# Patient Record
Sex: Male | Born: 1965 | Race: White | Hispanic: No | Marital: Single | Smoking: Current some day smoker
Health system: Southern US, Community
[De-identification: ages and names within clinical notes are randomized; demographics above are authoritative.]

## PROBLEM LIST (undated history)

## (undated) DIAGNOSIS — G5600 Carpal tunnel syndrome, unspecified upper limb: Secondary | ICD-10-CM

## (undated) DIAGNOSIS — M5020 Other cervical disc displacement, unspecified cervical region: Secondary | ICD-10-CM

---

## 1998-12-06 ENCOUNTER — Emergency Department (HOSPITAL_COMMUNITY): Admission: EM | Admit: 1998-12-06 | Discharge: 1998-12-06 | Payer: Self-pay | Admitting: Emergency Medicine

## 2005-05-02 ENCOUNTER — Emergency Department (HOSPITAL_COMMUNITY): Admission: EM | Admit: 2005-05-02 | Discharge: 2005-05-02 | Payer: Self-pay | Admitting: Emergency Medicine

## 2006-03-07 ENCOUNTER — Emergency Department (HOSPITAL_COMMUNITY): Admission: EM | Admit: 2006-03-07 | Discharge: 2006-03-07 | Payer: Self-pay | Admitting: Emergency Medicine

## 2006-03-11 ENCOUNTER — Emergency Department (HOSPITAL_COMMUNITY): Admission: EM | Admit: 2006-03-11 | Discharge: 2006-03-11 | Payer: Self-pay | Admitting: Emergency Medicine

## 2006-12-21 ENCOUNTER — Emergency Department (HOSPITAL_COMMUNITY): Admission: EM | Admit: 2006-12-21 | Discharge: 2006-12-21 | Payer: Self-pay | Admitting: Emergency Medicine

## 2007-10-04 ENCOUNTER — Emergency Department (HOSPITAL_COMMUNITY): Admission: EM | Admit: 2007-10-04 | Discharge: 2007-10-04 | Payer: Self-pay | Admitting: Emergency Medicine

## 2007-11-18 ENCOUNTER — Emergency Department (HOSPITAL_COMMUNITY): Admission: EM | Admit: 2007-11-18 | Discharge: 2007-11-18 | Payer: Self-pay | Admitting: Emergency Medicine

## 2008-07-24 ENCOUNTER — Emergency Department (HOSPITAL_COMMUNITY): Admission: EM | Admit: 2008-07-24 | Discharge: 2008-07-24 | Payer: Self-pay | Admitting: Emergency Medicine

## 2008-07-27 ENCOUNTER — Emergency Department (HOSPITAL_COMMUNITY): Admission: EM | Admit: 2008-07-27 | Discharge: 2008-07-27 | Payer: Self-pay | Admitting: Emergency Medicine

## 2008-12-23 ENCOUNTER — Emergency Department (HOSPITAL_COMMUNITY): Admission: EM | Admit: 2008-12-23 | Discharge: 2008-12-23 | Payer: Self-pay | Admitting: Emergency Medicine

## 2008-12-27 ENCOUNTER — Emergency Department (HOSPITAL_COMMUNITY): Admission: EM | Admit: 2008-12-27 | Discharge: 2008-12-27 | Payer: Self-pay | Admitting: Emergency Medicine

## 2009-01-03 ENCOUNTER — Emergency Department (HOSPITAL_COMMUNITY): Admission: EM | Admit: 2009-01-03 | Discharge: 2009-01-03 | Payer: Self-pay | Admitting: Emergency Medicine

## 2009-01-09 ENCOUNTER — Emergency Department (HOSPITAL_COMMUNITY): Admission: EM | Admit: 2009-01-09 | Discharge: 2009-01-09 | Payer: Self-pay | Admitting: Emergency Medicine

## 2009-04-13 ENCOUNTER — Emergency Department (HOSPITAL_COMMUNITY): Admission: EM | Admit: 2009-04-13 | Discharge: 2009-04-13 | Payer: Self-pay | Admitting: Emergency Medicine

## 2009-05-17 ENCOUNTER — Emergency Department (HOSPITAL_COMMUNITY): Admission: EM | Admit: 2009-05-17 | Discharge: 2009-05-17 | Payer: Self-pay | Admitting: Emergency Medicine

## 2010-07-05 LAB — URINALYSIS, ROUTINE W REFLEX MICROSCOPIC
Bilirubin Urine: NEGATIVE
Bilirubin Urine: NEGATIVE
Glucose, UA: NEGATIVE mg/dL
Glucose, UA: NEGATIVE mg/dL
Hgb urine dipstick: NEGATIVE
Ketones, ur: NEGATIVE mg/dL
Nitrite: NEGATIVE
Protein, ur: NEGATIVE mg/dL
Protein, ur: NEGATIVE mg/dL
Specific Gravity, Urine: 1.028 (ref 1.005–1.030)
Urobilinogen, UA: 1 mg/dL (ref 0.0–1.0)
Urobilinogen, UA: 1 mg/dL (ref 0.0–1.0)
pH: 6 (ref 5.0–8.0)
pH: 6.5 (ref 5.0–8.0)

## 2012-07-09 ENCOUNTER — Emergency Department (HOSPITAL_COMMUNITY): Payer: Self-pay

## 2012-07-09 ENCOUNTER — Encounter (HOSPITAL_COMMUNITY): Payer: Self-pay | Admitting: Emergency Medicine

## 2012-07-09 ENCOUNTER — Emergency Department (HOSPITAL_COMMUNITY)
Admission: EM | Admit: 2012-07-09 | Discharge: 2012-07-09 | Disposition: A | Discharge status: Home Health | Payer: Self-pay | Attending: Emergency Medicine | Admitting: Emergency Medicine

## 2012-07-09 DIAGNOSIS — M25519 Pain in unspecified shoulder: Secondary | ICD-10-CM | POA: Insufficient documentation

## 2012-07-09 DIAGNOSIS — R0602 Shortness of breath: Secondary | ICD-10-CM | POA: Insufficient documentation

## 2012-07-09 DIAGNOSIS — F17200 Nicotine dependence, unspecified, uncomplicated: Secondary | ICD-10-CM | POA: Insufficient documentation

## 2012-07-09 DIAGNOSIS — M25512 Pain in left shoulder: Secondary | ICD-10-CM

## 2012-07-09 DIAGNOSIS — R079 Chest pain, unspecified: Secondary | ICD-10-CM

## 2012-07-09 LAB — CBC
HCT: 47.7 % (ref 39.0–52.0)
MCH: 31.3 pg (ref 26.0–34.0)
MCHC: 35 g/dL (ref 30.0–36.0)

## 2012-07-09 LAB — BASIC METABOLIC PANEL
CO2: 23 mEq/L (ref 19–32)
Glucose, Bld: 113 mg/dL — ABNORMAL HIGH (ref 70–99)
Sodium: 138 mEq/L (ref 135–145)

## 2012-07-09 LAB — D-DIMER, QUANTITATIVE: D-Dimer, Quant: <0.27 ug/mL-FEU (ref 0.00–0.48)

## 2012-07-09 MED ORDER — CYCLOBENZAPRINE HCL 10 MG PO TABS
10.0000 mg | ORAL_TABLET | Freq: Once | ORAL | Status: AC
  Administered 2012-07-09: 10 mg via ORAL
  Filled 2012-07-09: qty 1

## 2012-07-09 MED ORDER — NAPROXEN 250 MG PO TABS
500.0000 mg | ORAL_TABLET | Freq: Once | ORAL | Status: AC
  Administered 2012-07-09: 500 mg via ORAL
  Filled 2012-07-09: qty 2

## 2012-07-09 MED ORDER — CYCLOBENZAPRINE HCL 10 MG PO TABS: 10.0000 mg | ORAL_TABLET | Freq: Two times a day (BID) | ORAL | Status: DC | PRN

## 2012-07-09 MED ORDER — NAPROXEN 500 MG PO TABS: 500.0000 mg | ORAL_TABLET | Freq: Two times a day (BID) | ORAL | Status: DC

## 2012-07-09 NOTE — ED Provider Notes (Signed)
History     CSN: [id number]  Arrival date & time 07/09/12  4098   First MD Initiated Contact with Patient 07/09/12 1018      Chief Complaint  Patient presents with  . Chest Pain  . Arm Pain    (Consider location/radiation/quality/duration/timing/severity/associated sxs/prior treatment) HPI Comments: Patient is a 47 year old male with no significant past medical history who presents with chest pain for the past 3 days. Patient reports having gradual onset of aching chest pain that was located in the left chest and radiated to left shoulder. The patient reports associated shoulder pain that radiates to his chest with shoulder movement and SOB. The pain has been constant and made worse with left shoulder movement. He has not taken anything for pain. No alleviating factors. Patient denies fever, headache, visual changes, vomiting, diarrhea, abdominal pain, numbness/tingling.     History reviewed. No pertinent past medical history.  History reviewed. No pertinent past surgical history.  No family history on file.  History  Substance Use Topics  . Smoking status: Current Every Day Smoker  . Smokeless tobacco: Not on file  . Alcohol Use: Yes      Review of Systems  Respiratory: Positive for shortness of breath.   Cardiovascular: Positive for chest pain.  Musculoskeletal: Positive for arthralgias.  All other systems reviewed and are negative.    Allergies  Review of patient's allergies indicates no known allergies.  Home Medications  No current outpatient prescriptions on file.  BP 124/84  Pulse 90  Temp(Src) 98.3 F (36.8 C) (Oral)  Resp 16  Ht 5\' 8"  (1.727 m)  Wt 190 lb (86.183 kg)  BMI 28.9 kg/m2  SpO2 97%  Physical Exam  Nursing note and vitals reviewed. Constitutional: He is oriented to person, place, and time. He appears well-developed and well-nourished. No distress.  HENT:  Head: Normocephalic and atraumatic.  Eyes: Conjunctivae and EOM are normal.   Neck: Normal range of motion.  Cardiovascular: Normal rate and regular rhythm.  Exam reveals no gallop and no friction rub.   No murmur heard. Pulmonary/Chest: Effort normal and breath sounds normal. He has no wheezes. He has no rales. He exhibits no tenderness.  Abdominal: Soft. He exhibits no distension. There is no tenderness. There is no rebound.  Musculoskeletal:  Left shoulder ROM limited due to pain.   Neurological: He is alert and oriented to person, place, and time. Coordination normal.  Speech is goal-oriented. Moves limbs without ataxia.   Skin: Skin is warm and dry.  Psychiatric: He has a normal mood and affect. His behavior is normal.    ED Course  Procedures (including critical care time)  Labs Reviewed  BASIC METABOLIC PANEL - Abnormal; Notable for the following:    Glucose, Bld 113 (*)    GFR calc non Af Amer 83 (*)    All other components within normal limits  CBC  D-DIMER, QUANTITATIVE  POCT I-STAT TROPONIN I   Dg Chest 2 View  07/09/2012  *RADIOLOGY REPORT*  Clinical Data: Left upper chest and shoulder pain  CHEST - 2 VIEW  Comparison: Prior thoracic spine radiographs including a portion of the chest dated 10/04/2007  Findings: Negative for pulmonary edema, focal consolidation, pneumothorax or pleural effusion.  The heart is within normal limits for size.  The thoracic aorta is tortuous.  Mild central bronchitic changes and prominence of the interstitial markings.  No acute osseous abnormality.  IMPRESSION:  1.  No acute cardiopulmonary process. 2.  Mildly tortuous  thoracic aorta 3.  Mild central bronchitic changes and nonspecific prominence of the interstitial markings.   Original Report Authenticated By: Malachy Moan, M.D.      1. Chest pain   2. Left shoulder pain       MDM  10:21 AM Labs and chest xray pending.   1:02 PM Labs, chest xray, d-dimer and troponin unremarkable. Patient feeling better after Naproxen. Patient likely having chest pain  that is musculoskeletal and/or radiating from his left shoulder. The pain is reproducible and made worse with movement, making the pain unlikely cardiac. Patient will be treated with Naproxen and flexeril. Patient will have recommended follow up with a medical provider from the resource guide. Patient instructed to return with worsening or concerning symptoms.      Emilia Beck, PA-C 07/09/12 1310

## 2012-07-09 NOTE — ED Provider Notes (Signed)
Medical screening examination/treatment/procedure(s) were performed by non-physician practitioner and as supervising physician I was immediately available for consultation/collaboration.  Gladys Deckard L Kalli Greenfield, MD 07/09/12 1735 

## 2012-07-09 NOTE — ED Notes (Signed)
Onset 3 days left side chest pain radiating to left upper extremity with shortness of breath.  States pain worsening with certain movements of left upper extremity. Full sensation radial pulse +2.

## 2012-11-01 ENCOUNTER — Encounter (HOSPITAL_COMMUNITY): Payer: Self-pay | Admitting: Cardiology

## 2012-11-01 ENCOUNTER — Emergency Department (HOSPITAL_COMMUNITY)
Admission: EM | Admit: 2012-11-01 | Discharge: 2012-11-01 | Disposition: A | Discharge status: Home / Self Care | Payer: Self-pay | Attending: Emergency Medicine | Admitting: Emergency Medicine

## 2012-11-01 DIAGNOSIS — M542 Cervicalgia: Secondary | ICD-10-CM | POA: Insufficient documentation

## 2012-11-01 DIAGNOSIS — Z885 Allergy status to narcotic agent status: Secondary | ICD-10-CM | POA: Insufficient documentation

## 2012-11-01 DIAGNOSIS — F17200 Nicotine dependence, unspecified, uncomplicated: Secondary | ICD-10-CM | POA: Insufficient documentation

## 2012-11-01 MED ORDER — DIAZEPAM 5 MG PO TABS
5.0000 mg | ORAL_TABLET | Freq: Once | ORAL | Status: AC
  Administered 2012-11-01: 5 mg via ORAL
  Filled 2012-11-01: qty 1

## 2012-11-01 MED ORDER — DIAZEPAM 5 MG PO TABS: 5.0000 mg | ORAL_TABLET | Freq: Four times a day (QID) | ORAL | Status: DC | PRN

## 2012-11-01 NOTE — ED Provider Notes (Signed)
CSN: [id number]     Arrival date & time 11/01/12  1245 History     First MD Initiated Contact with Patient 11/01/12 1344     Chief Complaint  Patient presents with  . Neck Pain   (Consider location/radiation/quality/duration/timing/severity/associated sxs/prior Treatment) HPI  Christopher Hughes is a 47 y.o. male complaining of left-sided neck pain worsening over the course last 48 hours. Patient has been taking ibuprofen at home with no relief. He denies any trauma to the area. Denies weakness, numbness, paresthesia. He rates his pain as severe, 8/10, exacerbated by right lateral flexion.    History reviewed. No pertinent past medical history. History reviewed. No pertinent past surgical history. History reviewed. No pertinent family history. History  Substance Use Topics  . Smoking status: Current Every Day Smoker  . Smokeless tobacco: Not on file  . Alcohol Use: Yes    Review of Systems 10 systems reviewed and found to be negative, except as noted in the HPI  Allergies  Codeine  Home Medications   Current Outpatient Rx  Name  Route  Sig  Dispense  Refill  . Ibuprofen (IBU PO)   Oral   Take 4 tablets by mouth every 6 (six) hours as needed (pain).         . diazepam (VALIUM) 5 MG tablet   Oral   Take 1 tablet (5 mg total) by mouth every 6 (six) hours as needed (muscle spasms).   15 tablet   0    BP 137/85  Pulse 78  Temp(Src) 97.7 F (36.5 C) (Oral)  Resp 20  SpO2 98% Physical Exam  Nursing note and vitals reviewed. Constitutional: He is oriented to person, place, and time. He appears well-developed and well-nourished. No distress.  HENT:  Head: Normocephalic and atraumatic.  Mouth/Throat: Oropharynx is clear and moist.  Eyes: Conjunctivae and EOM are normal. Pupils are equal, round, and reactive to light.  Neck: Normal range of motion. Neck supple.  No midline tenderness to palpation or step-offs appreciated. Patient has full range of motion without  pain.  Left-sided paracervical musculature spasm and tenderness to palpation  Cardiovascular: Normal rate.   Pulmonary/Chest: Effort normal and breath sounds normal. No stridor. No respiratory distress. He has no wheezes. He has no rales. He exhibits no tenderness.  Abdominal: Soft. Bowel sounds are normal. He exhibits no distension and no mass. There is no tenderness. There is no rebound and no guarding.  Musculoskeletal: Normal range of motion. He exhibits no edema and no tenderness.  Neurological: He is alert and oriented to person, place, and time.  Follows commands, Goal oriented speech, Strength is 5 out of 5x4 extremities, patient ambulates with a coordinated in nonantalgic gait. Sensation is grossly intact.   Psychiatric: He has a normal mood and affect.    ED Course   Procedures (including critical care time)  Labs Reviewed - No data to display No results found. 1. Cervicalgia     MDM   Filed Vitals:   11/01/12 1303  BP: 137/85  Pulse: 78  Temp: 97.7 F (36.5 C)  TempSrc: Oral  Resp: 20  SpO2: 98%     Christopher Hughes is a 47 y.o. male with atraumatic paracervical spasm. Neuro exam normal.  Medications  diazepam (VALIUM) tablet 5 mg (5 mg Oral Given 11/01/12 1400)   Pt is hemodynamically stable, appropriate for, and amenable to discharge at this time. Pt verbalized understanding and agrees with care plan. All questions answered. Outpatient follow-up and  specific return precautions discussed.    Discharge Medication List as of 11/01/2012  1:52 PM    START taking these medications   Details  diazepam (VALIUM) 5 MG tablet Take 1 tablet (5 mg total) by mouth every 6 (six) hours as needed (muscle spasms)., Starting 11/01/2012, Until Discontinued, Print        Note: Portions of this report may have been transcribed using voice recognition software. Every effort was made to ensure accuracy; however, inadvertent computerized transcription errors may be  present    Wynetta Emery, PA-C 11/01/12 1603  Keshan Reha, PA-C 11/01/12 1604

## 2012-11-01 NOTE — ED Notes (Signed)
Pt reports left sided neck pain, states he thinks he tweeked his neck. Denies any recent injury.

## 2012-11-01 NOTE — ED Provider Notes (Signed)
Medical screening examination/treatment/procedure(s) were performed by non-physician practitioner and as supervising physician I was immediately available for consultation/collaboration.   Richardean Canal, MD 11/01/12 2006

## 2012-11-10 ENCOUNTER — Encounter (HOSPITAL_COMMUNITY): Payer: Self-pay | Admitting: *Deleted

## 2012-11-10 ENCOUNTER — Emergency Department (HOSPITAL_COMMUNITY)
Admission: EM | Admit: 2012-11-10 | Discharge: 2012-11-10 | Disposition: A | Discharge status: Home / Self Care | Payer: Self-pay | Attending: Emergency Medicine | Admitting: Emergency Medicine

## 2012-11-10 DIAGNOSIS — F17200 Nicotine dependence, unspecified, uncomplicated: Secondary | ICD-10-CM | POA: Insufficient documentation

## 2012-11-10 DIAGNOSIS — M542 Cervicalgia: Secondary | ICD-10-CM | POA: Insufficient documentation

## 2012-11-10 DIAGNOSIS — IMO0002 Reserved for concepts with insufficient information to code with codable children: Secondary | ICD-10-CM | POA: Insufficient documentation

## 2012-11-10 DIAGNOSIS — M5416 Radiculopathy, lumbar region: Secondary | ICD-10-CM

## 2012-11-10 DIAGNOSIS — M5412 Radiculopathy, cervical region: Secondary | ICD-10-CM | POA: Insufficient documentation

## 2012-11-10 MED ORDER — PREDNISONE 20 MG PO TABS
60.0000 mg | ORAL_TABLET | Freq: Once | ORAL | Status: AC
  Administered 2012-11-10: 60 mg via ORAL
  Filled 2012-11-10: qty 3

## 2012-11-10 MED ORDER — TRAMADOL HCL 50 MG PO TABS: 50.0000 mg | ORAL_TABLET | Freq: Four times a day (QID) | ORAL | Status: DC | PRN

## 2012-11-10 MED ORDER — PREDNISONE 20 MG PO TABS: ORAL_TABLET | ORAL | Status: DC

## 2012-11-10 NOTE — ED Notes (Signed)
The pt has a "knot" on his head for 2 weeks and th pain is getting worse
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
## 2012-11-10 NOTE — ED Notes (Signed)
Pt here 8/4 for neck pain. Now states he has "golf Jakyrie Totherow size knot" on back of head.

## 2012-11-11 NOTE — ED Provider Notes (Signed)
Medical screening examination/treatment/procedure(s) were performed by non-physician practitioner and as supervising physician I was immediately available for consultation/collaboration.   Junius Argyle, MD 11/11/12 1045

## 2014-10-04 ENCOUNTER — Emergency Department (HOSPITAL_COMMUNITY)
Admission: EM | Admit: 2014-10-04 | Discharge: 2014-10-04 | Disposition: A | Discharge status: Home / Self Care | Payer: Self-pay | Attending: Emergency Medicine | Admitting: Emergency Medicine

## 2014-10-04 ENCOUNTER — Encounter (HOSPITAL_COMMUNITY): Payer: Self-pay | Admitting: General Practice

## 2014-10-04 DIAGNOSIS — F1429 Cocaine dependence with unspecified cocaine-induced disorder: Secondary | ICD-10-CM | POA: Insufficient documentation

## 2014-10-04 DIAGNOSIS — Z8739 Personal history of other diseases of the musculoskeletal system and connective tissue: Secondary | ICD-10-CM | POA: Insufficient documentation

## 2014-10-04 DIAGNOSIS — F1210 Cannabis abuse, uncomplicated: Secondary | ICD-10-CM | POA: Insufficient documentation

## 2014-10-04 DIAGNOSIS — Z720 Tobacco use: Secondary | ICD-10-CM | POA: Insufficient documentation

## 2014-10-04 DIAGNOSIS — Z8669 Personal history of other diseases of the nervous system and sense organs: Secondary | ICD-10-CM | POA: Insufficient documentation

## 2014-10-04 DIAGNOSIS — Z79899 Other long term (current) drug therapy: Secondary | ICD-10-CM | POA: Insufficient documentation

## 2014-10-04 HISTORY — DX: Carpal tunnel syndrome, unspecified upper limb: G56.00

## 2014-10-04 HISTORY — DX: Other cervical disc displacement, unspecified cervical region: M50.20

## 2014-10-04 LAB — I-STAT CHEM 8, ED
BUN: 15 mg/dL (ref 6–20)
Calcium, Ion: 1.21 mmol/L (ref 1.12–1.23)
Chloride: 105 mmol/L (ref 101–111)
Creatinine, Ser: 1 mg/dL (ref 0.61–1.24)
Glucose, Bld: 94 mg/dL (ref 65–99)
HCT: 48 % (ref 39.0–52.0)
Hemoglobin: 16.3 g/dL (ref 13.0–17.0)
Potassium: 4 mmol/L (ref 3.5–5.1)
Sodium: 140 mmol/L (ref 135–145)
TCO2: 22 mmol/L (ref 0–100)

## 2014-10-04 LAB — RAPID URINE DRUG SCREEN, HOSP PERFORMED
Amphetamines: NOT DETECTED
Barbiturates: NOT DETECTED
Benzodiazepines: NOT DETECTED
Cocaine: POSITIVE — AB
Opiates: NOT DETECTED
Tetrahydrocannabinol: POSITIVE — AB

## 2014-10-04 LAB — ETHANOL: Alcohol, Ethyl (B): 9 mg/dL — ABNORMAL HIGH (ref <5)

## 2014-10-04 NOTE — Discharge Instructions (Signed)
The patient is not suicidal, he will need help with cocaine usage

## 2014-10-04 NOTE — ED Notes (Signed)
Pt coming to ED for medical clearance. 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 Pts last drink was this morning at 1205 and was a 24 ounce. Last cocaine usage was the fourth of July and was about 750 dollars worth. Pt requesting help with getting clean and sober. Pt wants to be apart [facility] but needs SI clearance from the hospital. Pt denies any SI/HI.

## 2014-10-04 NOTE — ED Provider Notes (Signed)
CSN: [id number]     Arrival date & time 10/04/14  0955 History   First MD Initiated Contact with Patient 10/04/14 1017     Chief Complaint  Patient presents with  . Medical Clearance     (Consider location/radiation/quality/duration/timing/severity/associated sxs/prior Treatment) HPI Patient presents to the emergency department requesting clearance for detox.  The patient states that the detox center that he would like to go to his request that he come be evaluated medically and have a statement written that he is not suicidal.  The patient denies being suicidal to me.  He states that he has been using cocaine and alcohol.  The patient states that he went on a binge over the last few days.  He states that he needs it is likely not because he is 49 years old and this is not the right thing to do.  The patient states that he spoke with the assessment team at [facility] and they will accept him.  Once this is completed.  The patient denies chest pain, shortness of breath, nausea, vomiting, weakness, dizziness, headache, blurred vision, hallucinations, suicidal ideation, abdominal pain, diarrhea, fever or syncope.  The patient states that he has been using drugs and alcohol for quite a while Past Medical History  Diagnosis Date  . Carpal tunnel syndrome   . Herniated cervical disc    No past surgical history on file. No family history on file. History  Substance Use Topics  . Smoking status: Current Every Day Smoker -- 0.50 packs/day    Types: Cigarettes  . Smokeless tobacco: Not on file  . Alcohol Use: 21.0 oz/week    35 Cans of beer per week     Comment: pt drinks 1/5 of liquor a day    Review of Systems  All other systems negative except as documented in the HPI. All pertinent positives and negatives as reviewed in the HPI. Allergies  Codeine  Home Medications   Prior to Admission medications   Medication Sig Start Date End Date Taking? Authorizing Provider  diazepam (VALIUM) 5 MG  tablet Take 5 mg by mouth every 6 (six) hours as needed (for anxiety/muscle spasms).   Yes Historical Provider, MD  ibuprofen (ADVIL,MOTRIN) 200 MG tablet Take 800 mg by mouth every 8 (eight) hours as needed for pain.   Yes Historical Provider, MD  Multiple Vitamins-Minerals (MULTIVITAMIN WITH MINERALS) tablet Take 1 tablet by mouth daily.   Yes Historical Provider, MD  traMADol (ULTRAM) 50 MG tablet Take 1 tablet (50 mg total) by mouth every 6 (six) hours as needed for pain. 11/10/12  Yes Robyn M Hess, PA-C   BP 110/67 mmHg  Pulse 85  Temp(Src) 98 F (36.7 C) (Oral)  Resp 16  SpO2 97% Physical Exam  Constitutional: He is oriented to person, place, and time. He appears well-developed and well-nourished. No distress.  HENT:  Head: Normocephalic and atraumatic.  Mouth/Throat: Oropharynx is clear and moist.  Eyes: Pupils are equal, round, and reactive to light.  Neck: Normal range of motion. Neck supple.  Cardiovascular: Normal rate and regular rhythm.  Exam reveals no gallop and no friction rub.   No murmur heard. Pulmonary/Chest: Effort normal and breath sounds normal. No respiratory distress.  Neurological: He is alert and oriented to person, place, and time. He exhibits normal muscle tone. Coordination normal.  Skin: Skin is warm and dry. No rash noted. No erythema.  Psychiatric: He has a normal mood and affect. His behavior is normal. Judgment and thought content  normal.  Nursing note and vitals reviewed.   ED Course  Procedures (including critical care time) Labs Review Labs Reviewed  URINE RAPID DRUG SCREEN, HOSP PERFORMED - Abnormal; Notable for the following:    Cocaine POSITIVE (*)    Tetrahydrocannabinol POSITIVE (*)    All other components within normal limits  ETHANOL - Abnormal; Notable for the following:    Alcohol, Ethyl (B) 9 (*)    All other components within normal limits  I-STAT CHEM 8, ED    Patient will be discharged and [facility].  He  agrees the plan and all questions were answered    Charlestine NightChristopher Haani Bakula, PA-C 10/04/14 1527  Margarita Grizzleanielle Ray, MD 10/06/14 [id number]

## 2018-03-29 ENCOUNTER — Encounter (HOSPITAL_COMMUNITY): Payer: Self-pay | Admitting: Emergency Medicine

## 2018-03-29 ENCOUNTER — Emergency Department (HOSPITAL_COMMUNITY)
Admission: EM | Admit: 2018-03-29 | Discharge: 2018-03-30 | Disposition: A | Discharge status: Home / Self Care | Payer: Self-pay | Attending: Emergency Medicine | Admitting: Emergency Medicine

## 2018-03-29 ENCOUNTER — Other Ambulatory Visit: Payer: Self-pay

## 2018-03-29 DIAGNOSIS — Z79899 Other long term (current) drug therapy: Secondary | ICD-10-CM | POA: Insufficient documentation

## 2018-03-29 DIAGNOSIS — G8929 Other chronic pain: Secondary | ICD-10-CM | POA: Insufficient documentation

## 2018-03-29 DIAGNOSIS — F17210 Nicotine dependence, cigarettes, uncomplicated: Secondary | ICD-10-CM | POA: Insufficient documentation

## 2018-03-29 DIAGNOSIS — M5442 Lumbago with sciatica, left side: Secondary | ICD-10-CM | POA: Insufficient documentation

## 2018-03-29 LAB — CBC WITH DIFFERENTIAL/PLATELET
Abs Immature Granulocytes: 0.03 10*3/uL (ref 0.00–0.07)
Basophils Absolute: 0.1 10*3/uL (ref 0.0–0.1)
Basophils Relative: 1 %
Eosinophils Absolute: 0.2 10*3/uL (ref 0.0–0.5)
Eosinophils Relative: 3 %
HCT: 49.5 % (ref 39.0–52.0)
Hemoglobin: 16.6 g/dL (ref 13.0–17.0)
Immature Granulocytes: 0 %
Lymphocytes Relative: 29 %
Lymphs Abs: 2.4 10*3/uL (ref 0.7–4.0)
MCH: 31.8 pg (ref 26.0–34.0)
MCHC: 33.5 g/dL (ref 30.0–36.0)
MCV: 94.8 fL (ref 80.0–100.0)
Monocytes Absolute: 0.7 10*3/uL (ref 0.1–1.0)
Monocytes Relative: 8 %
Neutro Abs: 5.1 10*3/uL (ref 1.7–7.7)
Neutrophils Relative %: 59 %
Platelets: 256 10*3/uL (ref 150–400)
RBC: 5.22 MIL/uL (ref 4.22–5.81)
RDW: 13 % (ref 11.5–15.5)
WBC: 8.5 10*3/uL (ref 4.0–10.5)
nRBC: 0 % (ref 0.0–0.2)

## 2018-03-29 LAB — BASIC METABOLIC PANEL
Anion gap: 9 (ref 5–15)
BUN: 19 mg/dL (ref 6–20)
CO2: 24 mmol/L (ref 22–32)
Calcium: 9.1 mg/dL (ref 8.9–10.3)
Chloride: 106 mmol/L (ref 98–111)
Creatinine, Ser: 1.35 mg/dL — ABNORMAL HIGH (ref 0.61–1.24)
GFR calc Af Amer: >60 mL/min (ref >60)
GFR calc non Af Amer: 59 mL/min — ABNORMAL LOW (ref >60)
Glucose, Bld: 101 mg/dL — ABNORMAL HIGH (ref 70–99)
Potassium: 3.9 mmol/L (ref 3.5–5.1)
Sodium: 139 mmol/L (ref 135–145)

## 2018-03-29 LAB — SEDIMENTATION RATE: Sed Rate: 3 mm/hr (ref 0–16)

## 2018-03-29 LAB — C-REACTIVE PROTEIN

## 2018-03-29 MED ORDER — KETOROLAC TROMETHAMINE 60 MG/2ML IM SOLN
15.0000 mg | Freq: Once | INTRAMUSCULAR | Status: AC
  Administered 2018-03-29: 15 mg via INTRAMUSCULAR
  Filled 2018-03-29: qty 2

## 2018-03-29 MED ORDER — DIAZEPAM 5 MG PO TABS
5.0000 mg | ORAL_TABLET | Freq: Once | ORAL | Status: AC
  Administered 2018-03-29: 5 mg via ORAL
  Filled 2018-03-29: qty 1

## 2018-03-29 MED ORDER — OXYCODONE HCL 5 MG PO TABS
5.0000 mg | ORAL_TABLET | Freq: Once | ORAL | Status: AC
  Administered 2018-03-29: 5 mg via ORAL
  Filled 2018-03-29: qty 1

## 2018-03-29 MED ORDER — ACETAMINOPHEN 500 MG PO TABS
1000.0000 mg | ORAL_TABLET | Freq: Once | ORAL | Status: AC
  Administered 2018-03-29: 1000 mg via ORAL
  Filled 2018-03-29: qty 2

## 2018-03-29 NOTE — ED Notes (Signed)
All clothing removed for MRI, except for gown

## 2018-03-29 NOTE — ED Provider Notes (Signed)
[facility] EMERGENCY DEPARTMENT Provider Note   CSN: [id number] Arrival date & time: 03/29/18  1656     History   Chief Complaint Chief Complaint  Patient presents with  . Back Pain    HPI Christopher Hughes is a 52 y.o. male.  52 yo M with a history of chronic back pain here with worsening of the same.  Going on for the past couple days.  Describes the pain is bilateral and radiating down the left leg.  He is never had the radiation down the leg and to him it feels somewhat numb and has been weak off and on.  He also has had some difficulty initiating a urinary stream and lost control of his bowels earlier today.  He denies fevers or chills.  Denies IV drug abuse.  Denies trauma.  The history is provided by the patient and the spouse.  Back Pain   This is a recurrent problem. The current episode started more than 2 days ago. The problem occurs constantly. The problem has not changed since onset.The pain is associated with no known injury. The pain is present in the lumbar spine. The quality of the pain is described as stabbing and shooting. The pain does not radiate. The pain is at a severity of 8/10. The pain is moderate. The symptoms are aggravated by bending and twisting. Associated symptoms include bowel incontinence, leg pain and weakness. Pertinent negatives include no chest pain, no fever, no headaches, no abdominal pain and no perianal numbness. He has tried nothing for the symptoms. The treatment provided no relief.    Past Medical History:  Diagnosis Date  . Carpal tunnel syndrome   . Herniated cervical disc     There are no active problems to display for this patient.   History reviewed. No pertinent surgical history.      Home Medications    Prior to Admission medications   Medication Sig Start Date End Date Taking? Authorizing Provider  acetaminophen (TYLENOL) 500 MG tablet Take 500-1,000 mg by mouth every 6 (six) hours as needed (for  pain).   Yes [provider]  Cholecalciferol (VITAMIN D-3) 25 MCG (1000 UT) CAPS Take 1,000 Units by mouth daily.   Yes [provider]  Melatonin 5 MG TABS Take 5 mg by mouth at bedtime as needed (for sleep).   Yes [provider]  Misc Natural Products (OSTEO BI-FLEX ADV JOINT SHIELD) TABS Take 1 tablet by mouth daily with breakfast.   Yes [provider]  multivitamin (ONE-A-DAY MEN'S) TABS tablet Take 1 tablet by mouth daily.   Yes [provider]  diazepam (VALIUM) 5 MG tablet Take 5 mg by mouth every 6 (six) hours as needed (for anxiety/muscle spasms).    [provider]  traMADol (ULTRAM) 50 MG tablet Take 1 tablet (50 mg total) by mouth every 6 (six) hours as needed for pain. Patient not taking: Reported on 03/29/2018 11/10/12   Kathrynn Speed, PA-C    Family History History reviewed. No pertinent family history.  Social History Social History   Tobacco Use  . Smoking status: Current Every Day Smoker    Packs/day: 0.50    Types: Cigarettes  . Smokeless tobacco: Never Used  Substance Use Topics  . Alcohol use: Yes    Alcohol/week: 35.0 standard drinks    Types: 35 Cans of beer per week    Comment: pt drinks 1/5 of liquor a day  . Drug use: No  Allergies   Codeine   Review of Systems Review of Systems  Constitutional: Negative for chills and fever.  HENT: Negative for congestion and facial swelling.   Eyes: Negative for discharge and visual disturbance.  Respiratory: Negative for shortness of breath.   Cardiovascular: Negative for chest pain and palpitations.  Gastrointestinal: Positive for bowel incontinence. Negative for abdominal pain, diarrhea and vomiting.  Musculoskeletal: Positive for back pain. Negative for arthralgias and myalgias.  Skin: Negative for color change and rash.  Neurological: Positive for weakness. Negative for tremors, syncope and headaches.  Psychiatric/Behavioral: Negative for confusion  and dysphoric mood.     Physical Exam Updated Vital Signs BP (!) 136/93   Pulse 72   Temp (!) 97.5 F (36.4 C) (Oral)   Resp 16   Ht 5\' 8"  (1.727 m)   Wt 77.6 kg   SpO2 95%   BMI 26.00 kg/m   Physical Exam Vitals signs and nursing note reviewed.  Constitutional:      Appearance: He is well-developed.  HENT:     Head: Normocephalic and atraumatic.  Eyes:     Pupils: Pupils are equal, round, and reactive to light.  Neck:     Musculoskeletal: Normal range of motion and neck supple.     Vascular: No JVD.  Cardiovascular:     Rate and Rhythm: Normal rate and regular rhythm.     Heart sounds: No murmur. No friction rub. No gallop.   Pulmonary:     Effort: No respiratory distress.     Breath sounds: No wheezing.  Abdominal:     General: There is no distension.     Tenderness: There is no guarding or rebound.  Genitourinary:    Comments: Intact rectal tone intact perirectal sensation Musculoskeletal: Normal range of motion.     Comments: Pulse motor and sensation is intact to the left lower extremity.  No noted clonus reflexes are 2+ and equal.  Skin:    Coloration: Skin is not pale.     Findings: No rash.  Neurological:     Mental Status: He is alert and oriented to person, place, and time.  Psychiatric:        Behavior: Behavior normal.      ED Treatments / Results  Labs (all labs ordered are listed, but only abnormal results are displayed) Labs Reviewed  BASIC METABOLIC PANEL - Abnormal; Notable for the following components:      Result Value   Glucose, Bld 101 (*)    Creatinine, Ser 1.35 (*)    GFR calc non Af Amer 59 (*)    All other components within normal limits  SEDIMENTATION RATE  C-REACTIVE PROTEIN  CBC WITH DIFFERENTIAL/PLATELET    EKG None  Radiology No results found.  Procedures Procedures (including critical care time)  Medications Ordered in ED Medications  acetaminophen (TYLENOL) tablet 1,000 mg (1,000 mg Oral Given 03/29/18 2218)   ketorolac (TORADOL) injection 15 mg (15 mg Intramuscular Given 03/29/18 2220)  oxyCODONE (Oxy IR/ROXICODONE) immediate release tablet 5 mg (5 mg Oral Given 03/29/18 2219)  diazepam (VALIUM) tablet 5 mg (5 mg Oral Given 03/29/18 2218)     Initial Impression / Assessment and Plan / ED Course  I have reviewed the triage vital signs and the nursing notes.  Pertinent labs & imaging results that were available during my care of the patient were reviewed by me and considered in my medical decision making (see chart for details).     52 yo M with  a chief complaint of chronic back pain but worsening over the past couple days now with new symptoms concerning for possible cauda equina with loss of bowel and difficulty with urination.  Objectively the patient has no change to his reflexes the patient has no clonus.  No appreciable weakness.  Postvoid residual of 35 cc.  I feel that cauda equina is low at the differential but with his new bilateral symptoms and loss of balance and difficulty with urination at home will obtain an MRI.  I told the patient separately away from his partner and he stated that he does not use IV drugs.  Signed out to Dr. Elesa MassedWard.  Awaiting MRI.    The patients results and plan were reviewed and discussed.   Any x-rays performed were independently reviewed by myself.   Differential diagnosis were considered with the presenting HPI.  Medications  acetaminophen (TYLENOL) tablet 1,000 mg (1,000 mg Oral Given 03/29/18 2218)  ketorolac (TORADOL) injection 15 mg (15 mg Intramuscular Given 03/29/18 2220)  oxyCODONE (Oxy IR/ROXICODONE) immediate release tablet 5 mg (5 mg Oral Given 03/29/18 2219)  diazepam (VALIUM) tablet 5 mg (5 mg Oral Given 03/29/18 2218)    Vitals:   03/29/18 1755 03/29/18 2012 03/29/18 2130 03/29/18 2230  BP:  (!) 148/91 123/72 (!) 136/93  Pulse:  84 73 72  Resp:  16 16 16   Temp:      TempSrc:      SpO2:  97% 97% 95%  Weight: 77.6 kg     Height: 5'  8" (1.727 m)       Final diagnoses:  Acute bilateral low back pain with left-sided sciatica       Final Clinical Impressions(s) / ED Diagnoses   Final diagnoses:  Acute bilateral low back pain with left-sided sciatica    ED Discharge Orders    None       Melene PlanFloyd, Adlean Hardeman, DO 03/30/18 0011

## 2018-03-29 NOTE — ED Notes (Signed)
Pt walking outside. Pt states that they will be returning.

## 2018-03-29 NOTE — ED Triage Notes (Addendum)
Pt arrives to ED from home with complaints of lower back pain since 12/26. Pt reports pain starts in lumbar region of back and radiates to his left leg. Pt has hx of herniated disc. Pt ambulatory at this time.

## 2018-03-30 ENCOUNTER — Emergency Department (HOSPITAL_COMMUNITY): Payer: Self-pay

## 2018-03-30 MED ORDER — PREDNISONE 20 MG PO TABS
60.0000 mg | ORAL_TABLET | Freq: Once | ORAL | Status: AC
  Administered 2018-03-30: 60 mg via ORAL
  Filled 2018-03-30: qty 3

## 2018-03-30 MED ORDER — PREDNISONE 10 MG (21) PO TBPK: ORAL_TABLET | ORAL | 0 refills | Status: DC

## 2018-03-30 MED ORDER — IBUPROFEN 800 MG PO TABS: 800.0000 mg | ORAL_TABLET | Freq: Three times a day (TID) | ORAL | 0 refills | Status: DC | PRN

## 2018-03-30 NOTE — ED Provider Notes (Signed)
12:00 AM  Assumed care from Dr. Adela LankFloyd.  Patient is a 52 year old male with history of chronic back pain who presents with worsening back pain, left leg numbness and weakness and bowel incontinence.  Normal rectal tone, and sensation per previous provider and post void residual of only 35 mL.  Labs have been unremarkable today.  MRI of the T and L-spine pending to rule out any neurosurgical emergency.  2:45 AM  Pt's MRI shows a normal thoracic spine.  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  He does complain of some symptoms of L3 left-sided radiculopathy with numbness intermittently in the anterior part of the thigh.  Also reports numbness in the posterior thigh but he has no findings to correlate with this.  Will place him on a steroid taper given there may be some inflammation, irritation of this left L3 nerve root.  Nothing at this time that needs emergent neurosurgical intervention.  Will give outpatient PCP follow-up.  Recommended alternating Tylenol and Motrin for pain.  Will avoid narcotics in this patient given he does have a history of substance abuse previously with documented cocaine use in 2016.  He has been able to ambulate here without difficulty.  Patient comfortable with this plan.   At this time, I do not feel there is any life-threatening condition present. I have reviewed and discussed all results (EKG, imaging, lab, urine as appropriate) and exam findings with patient/family. I have reviewed nursing notes and appropriate previous records.  I feel the patient is safe to be discharged home without further emergent workup and can continue workup as an outpatient as needed. Discussed usual and customary return precautions. Patient/family verbalize understanding and are comfortable with this plan.  Outpatient follow-up has been provided as needed. All questions have been  answered.    Abby Stines, Layla MawKristen N, DO 03/30/18 [phone]

## 2018-03-30 NOTE — ED Notes (Signed)
Patient transported to MRI 

## 2018-03-30 NOTE — Discharge Instructions (Addendum)
You may alternate Tylenol 1000 mg every 6 hours as needed for pain and Ibuprofen 800 mg every 8 hours as needed for pain.  Please take Ibuprofen with food. ° °Steps to find a Primary Care Provider (PCP): ° °Call [phone] or [phone] to access "[facility] Find a Doctor Service." ° °2.  You may also go on the [place] website at [email or web address] ° °3.  [facility] also frequently accepts new patients. ° ° [facility]  °[address] °[place] [address] °[phone] ° °4.  There are also multiple [facility], [facility] and [facility] practices throughout the [place] that are frequently accepting new patients. You may find a clinic that is close to your home and contact them. ° °[facility] °[email or web address] °[phone] ° °[facility] °.com ° °[facility] °[email or web address] °[phone] ° °[facility] °[email or web address] °[phone] ° °5.  Local Health Departments also can provide primary care services. ° °[facility]  °[address] ° [place] [address] °[phone] ° °[facility] °[address] °[place] [address] °[phone] ° °[facility] °[address]  °[place] [address] °[phone] ° ° °

## 2018-03-30 NOTE — ED Notes (Signed)
Patient Alert and oriented to baseline. Stable and ambulatory to baseline. Patient verbalized understanding of the discharge instructions.  Patient belongings were taken by the patient.   

## 2018-07-23 ENCOUNTER — Emergency Department (HOSPITAL_COMMUNITY)
Admission: EM | Admit: 2018-07-23 | Discharge: 2018-07-23 | Disposition: A | Discharge status: Home / Self Care | Payer: Self-pay | Attending: Emergency Medicine | Admitting: Emergency Medicine

## 2018-07-23 ENCOUNTER — Other Ambulatory Visit: Payer: Self-pay

## 2018-07-23 ENCOUNTER — Encounter (HOSPITAL_COMMUNITY): Payer: Self-pay

## 2018-07-23 ENCOUNTER — Emergency Department (HOSPITAL_COMMUNITY): Payer: Self-pay

## 2018-07-23 DIAGNOSIS — Z79899 Other long term (current) drug therapy: Secondary | ICD-10-CM | POA: Insufficient documentation

## 2018-07-23 DIAGNOSIS — R0602 Shortness of breath: Secondary | ICD-10-CM | POA: Insufficient documentation

## 2018-07-23 DIAGNOSIS — F17210 Nicotine dependence, cigarettes, uncomplicated: Secondary | ICD-10-CM | POA: Insufficient documentation

## 2018-07-23 DIAGNOSIS — F410 Panic disorder [episodic paroxysmal anxiety] without agoraphobia: Secondary | ICD-10-CM | POA: Insufficient documentation

## 2018-07-23 DIAGNOSIS — R0789 Other chest pain: Secondary | ICD-10-CM | POA: Insufficient documentation

## 2018-07-23 DIAGNOSIS — R079 Chest pain, unspecified: Secondary | ICD-10-CM

## 2018-07-23 LAB — CBC
HCT: 45.4 % (ref 39.0–52.0)
Hemoglobin: 15.3 g/dL (ref 13.0–17.0)
MCH: 31.9 pg (ref 26.0–34.0)
MCHC: 33.7 g/dL (ref 30.0–36.0)
MCV: 94.8 fL (ref 80.0–100.0)
Platelets: 268 10*3/uL (ref 150–400)
RBC: 4.79 MIL/uL (ref 4.22–5.81)
RDW: 12.9 % (ref 11.5–15.5)
WBC: 9.7 10*3/uL (ref 4.0–10.5)
nRBC: 0 % (ref 0.0–0.2)

## 2018-07-23 LAB — BASIC METABOLIC PANEL
Anion gap: 12 (ref 5–15)
BUN: 17 mg/dL (ref 6–20)
CO2: 22 mmol/L (ref 22–32)
Calcium: 9.3 mg/dL (ref 8.9–10.3)
Chloride: 106 mmol/L (ref 98–111)
Creatinine, Ser: 1.23 mg/dL (ref 0.61–1.24)
GFR calc Af Amer: >60 mL/min (ref >60)
GFR calc non Af Amer: >60 mL/min (ref >60)
Glucose, Bld: 131 mg/dL — ABNORMAL HIGH (ref 70–99)
Potassium: 4.1 mmol/L (ref 3.5–5.1)
Sodium: 140 mmol/L (ref 135–145)

## 2018-07-23 LAB — TROPONIN I: Troponin I: <0.03 ng/mL (ref <0.03)

## 2018-07-23 NOTE — ED Notes (Signed)
Patient verbalized understanding of dc instructions, vss, ambulatory with nad.   

## 2018-07-23 NOTE — ED Provider Notes (Signed)
[facility] EMERGENCY DEPARTMENT Provider Note   CSN: [id number] Arrival date & time: 07/23/18  7026    History   Chief Complaint Chief Complaint  Patient presents with  . Addiction Problem  . Chest Pain    HPI Christopher Hughes is a 53 y.o. male.     Patient presents to the emergency department for evaluation of chest pain.  Patient reports that he smoked wax earlier tonight.  He reports that he used to vape and recently quit.  He has been having trouble sleeping, so he tried to smoke packs to calm himself and get to sleep.  He took multiple hits prior to onset of feeling short of breath, heart racing, anxious with pains in the chest.  He reports now that he is here in the ER he is calming down and symptoms are resolving.  No previous history of heart disease.     Past Medical History:  Diagnosis Date  . Carpal tunnel syndrome   . Herniated cervical disc     There are no active problems to display for this patient.   History reviewed. No pertinent surgical history.      Home Medications    Prior to Admission medications   Medication Sig Start Date End Date Taking? Authorizing Provider  acetaminophen (TYLENOL) 500 MG tablet Take 500-1,000 mg by mouth every 6 (six) hours as needed (for pain).    [provider]  Cholecalciferol (VITAMIN D-3) 25 MCG (1000 UT) CAPS Take 1,000 Units by mouth daily.    [provider]  diazepam (VALIUM) 5 MG tablet Take 5 mg by mouth every 6 (six) hours as needed (for anxiety/muscle spasms).    [provider]  ibuprofen (ADVIL,MOTRIN) 800 MG tablet Take 1 tablet (800 mg total) by mouth every 8 (eight) hours as needed for mild pain. 03/30/18   Ward, Layla Maw, DO  Melatonin 5 MG TABS Take 5 mg by mouth at bedtime as needed (for sleep).    [provider]  Misc Natural Products (OSTEO BI-FLEX ADV JOINT SHIELD) TABS Take 1 tablet by mouth daily with breakfast.    [provider]   multivitamin (ONE-A-DAY MEN'S) TABS tablet Take 1 tablet by mouth daily.    [provider]  predniSONE (STERAPRED UNI-PAK 21 TAB) 10 MG (21) TBPK tablet Take as directed 03/30/18   Ward, Layla Maw, DO  traMADol (ULTRAM) 50 MG tablet Take 1 tablet (50 mg total) by mouth every 6 (six) hours as needed for pain. Patient not taking: Reported on 03/29/2018 11/10/12   Kathrynn Speed, PA-C    Family History History reviewed. No pertinent family history.  Social History Social History   Tobacco Use  . Smoking status: Current Every Day Smoker    Packs/day: 0.50    Types: Cigarettes  . Smokeless tobacco: Never Used  Substance Use Topics  . Alcohol use: Yes    Alcohol/week: 35.0 standard drinks    Types: 35 Cans of beer per week    Comment: pt drinks 1/5 of liquor a day  . Drug use: Yes     Allergies   Codeine   Review of Systems Review of Systems  Respiratory: Positive for shortness of breath.   Cardiovascular: Positive for chest pain and palpitations.  Psychiatric/Behavioral: The patient is nervous/anxious.   All other systems reviewed and are negative.    Physical Exam Updated Vital Signs BP 110/64   Pulse 75   Temp 98.5 F (36.9 C) (  Oral)   Resp 19   Ht 5\' 9"  (1.753 m)   Wt 90.7 kg   SpO2 98%   BMI 29.53 kg/m   Physical Exam Vitals signs and nursing note reviewed.  Constitutional:      General: He is not in acute distress.    Appearance: Normal appearance. He is well-developed.  HENT:     Head: Normocephalic and atraumatic.     Right Ear: Hearing normal.     Left Ear: Hearing normal.     Nose: Nose normal.  Eyes:     Conjunctiva/sclera: Conjunctivae normal.     Pupils: Pupils are equal, round, and reactive to light.  Neck:     Musculoskeletal: Normal range of motion and neck supple.  Cardiovascular:     Rate and Rhythm: Regular rhythm.     Heart sounds: S1 normal and S2 normal. No murmur. No friction rub. No gallop.   Pulmonary:     Effort:  Pulmonary effort is normal. No respiratory distress.     Breath sounds: Normal breath sounds.  Chest:     Chest wall: No tenderness.  Abdominal:     General: Bowel sounds are normal.     Palpations: Abdomen is soft.     Tenderness: There is no abdominal tenderness. There is no guarding or rebound. Negative signs include Murphy's sign and McBurney's sign.     Hernia: No hernia is present.  Musculoskeletal: Normal range of motion.  Skin:    General: Skin is warm and dry.     Findings: No rash.  Neurological:     Mental Status: He is alert and oriented to person, place, and time.     GCS: GCS eye subscore is 4. GCS verbal subscore is 5. GCS motor subscore is 6.     Cranial Nerves: No cranial nerve deficit.     Sensory: No sensory deficit.     Coordination: Coordination normal.  Psychiatric:        Speech: Speech normal.        Behavior: Behavior normal.        Thought Content: Thought content normal.      ED Treatments / Results  Labs (all labs ordered are listed, but only abnormal results are displayed) Labs Reviewed  BASIC METABOLIC PANEL - Abnormal; Notable for the following components:      Result Value   Glucose, Bld 131 (*)    All other components within normal limits  CBC  TROPONIN I    EKG None  Radiology Dg Chest 2 View  Result Date: 07/23/2018 CLINICAL DATA:  53 year old male with chest pain.  Smoker. EXAM: CHEST - 2 VIEW COMPARISON:  07/09/2012 chest radiographs. FINDINGS: PA and lateral views. Stable lung volumes and mediastinal contours. Visualized tracheal air column is within normal limits. Chronic bilateral increased pulmonary interstitial markings have not significantly changed since 2014 and are probably smoking related. No pneumothorax, pulmonary edema, pleural effusion or confluent pulmonary opacity. Negative visible bowel gas pattern. No acute osseous abnormality identified. IMPRESSION: No acute cardiopulmonary abnormality. Electronically Signed   By: Odessa FlemingH   Hall M.D.   On: 07/23/2018 04:08    Procedures Procedures (including critical care time)  Medications Ordered in ED Medications - No data to display   Initial Impression / Assessment and Plan / ED Course  I have reviewed the triage vital signs and the nursing notes.  Pertinent labs & imaging results that were available during my care of the patient were reviewed by me  and considered in my medical decision making (see chart for details).        Patient presents to the emergency department for evaluation of chest pain.  Patient does not have any history of heart disease and has very limited cardiac risk factors.  Heart score is 2.  Symptoms began after smoking highly concentrated THC.  He describes symptoms of anxiety, jitteriness, racing heartbeat, shortness of breath as well as the chest pain.  This sounds like a panic attack brought on by the [facility].  Symptoms resolved without intervention prior to arrival.  He has done well here in the ER.  EKG is unremarkable.  Troponin negative.  Patient reassured, no further work-up necessary at this time.  Final Clinical Impressions(s) / ED Diagnoses   Final diagnoses:  Chest pain, unspecified type  Panic attack    ED Discharge Orders    None       Gilda Crease, MD 07/23/18 [phone]

## 2018-07-23 NOTE — ED Triage Notes (Signed)
Pt reports "wax" is a form of THC.

## 2018-07-23 NOTE — ED Triage Notes (Signed)
Pt from home w/ a c/o chest pain that began after smoking a substance the pt called "wax." Pt smoked "wax" a "few hours ago," and the chest pain began at ~0100 today. CP was located in the center of his chest and radiated down his left arm. The pt's arms have been tensed and locked in position in front of him. CP free upon arrival to the hospital. Pt's pupils were initially 7 mm bilaterally w/ EMS, and about 4-5 mm upon arrival. Possible LOC. No N/V/D. No SOB.   Pt has had a cough for the past 3 days.

## 2018-07-23 NOTE — ED Notes (Signed)
Patient transported to Ultrasound 

## 2019-12-27 IMAGING — CR CHEST - 2 VIEW
2 series · 2 of 2 positions shown · non-contrast
Comparison: 07/09/2012 chest radiographs.

CLINICAL DATA: 52-year-old male with chest pain.  Smoker.

EXAM:
CHEST - 2 VIEW

[chest pa]
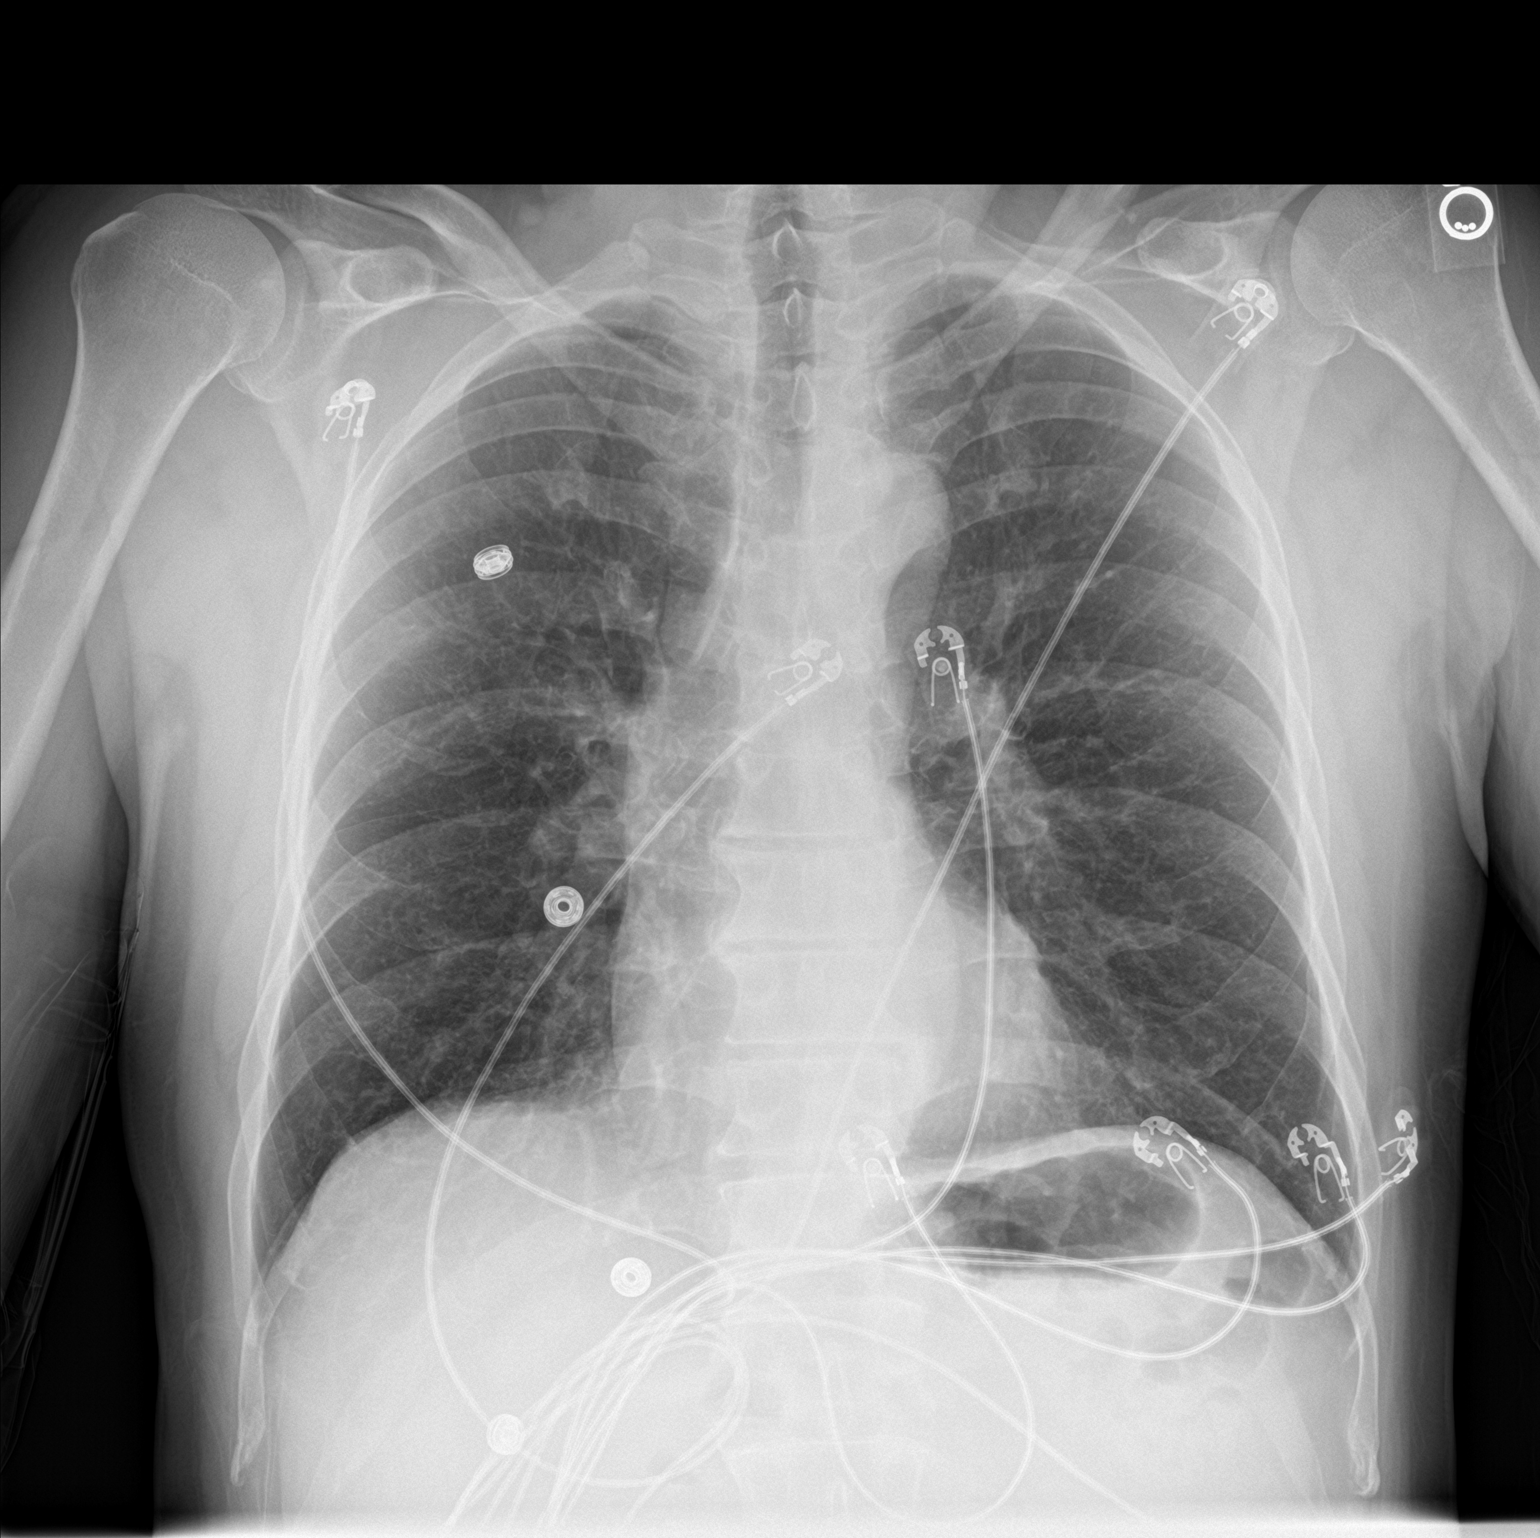

[chest lat]
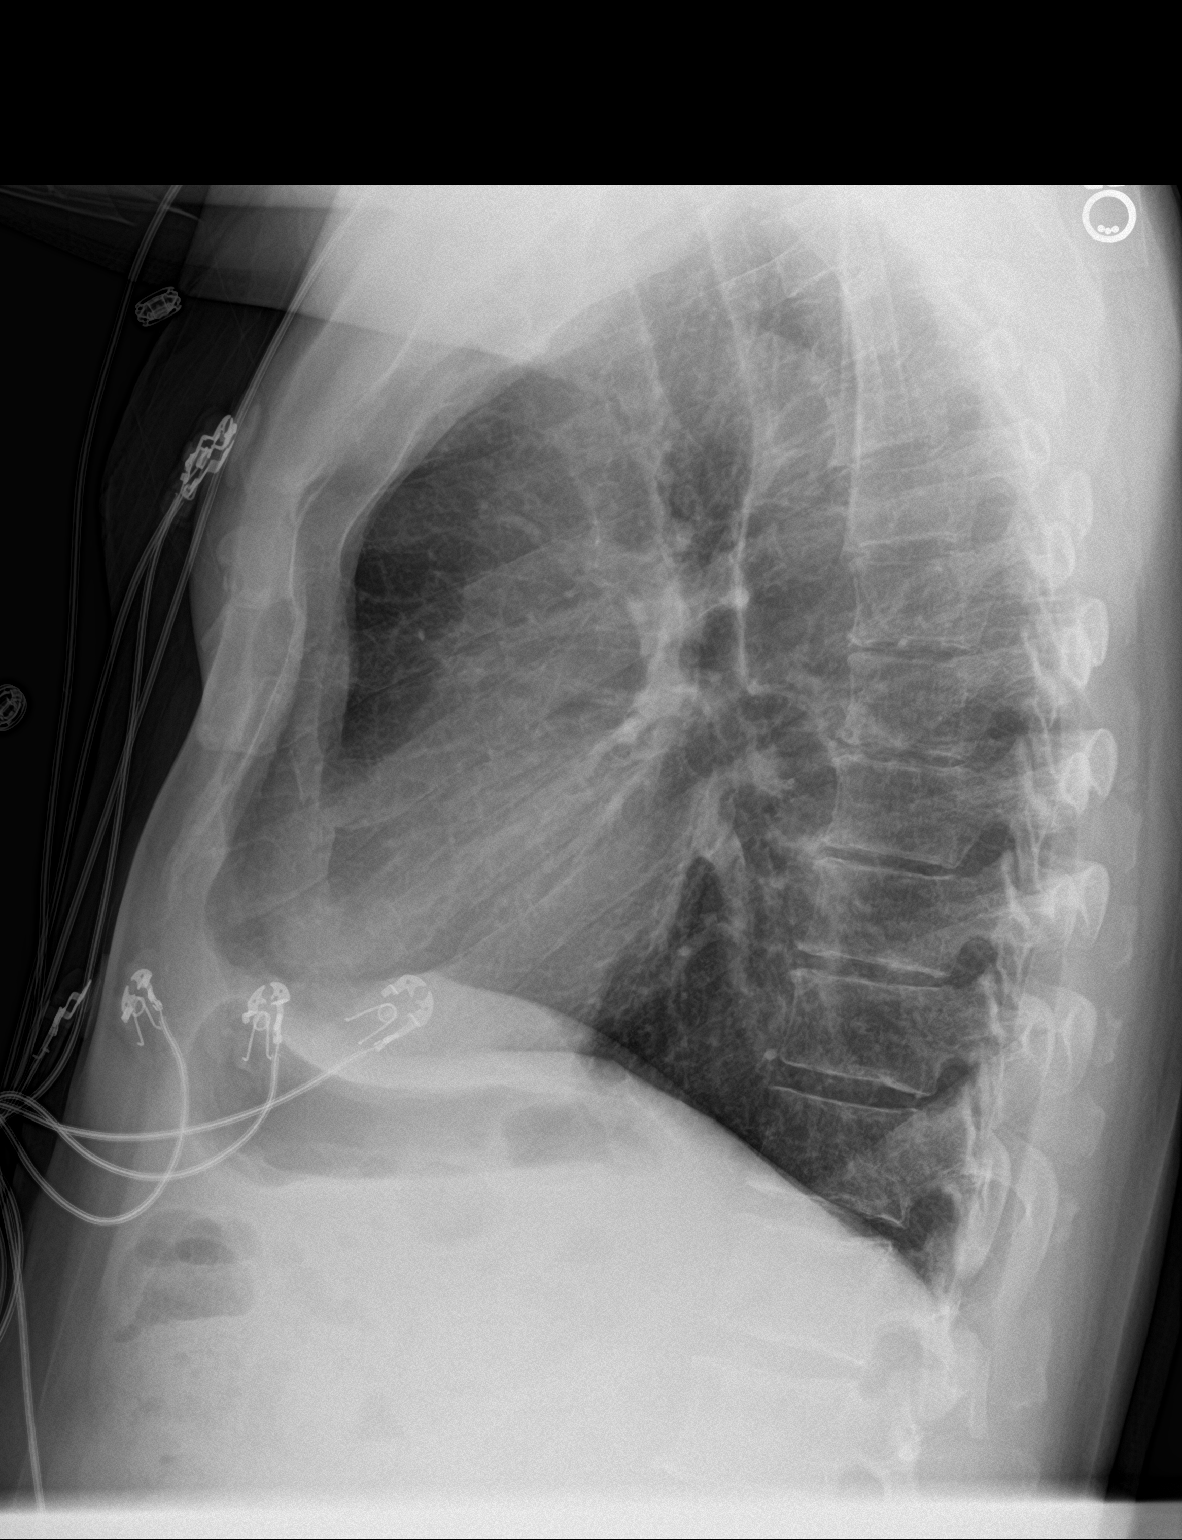

[2 of 2 positions shown; findings below may reference images not displayed]

FINDINGS: PA and lateral views. Stable lung volumes and mediastinal contours.
Visualized tracheal air column is within normal limits.

Chronic bilateral increased pulmonary interstitial markings have not
significantly changed since 1627 and are probably smoking related.
No pneumothorax, pulmonary edema, pleural effusion or confluent
pulmonary opacity. Negative visible bowel gas pattern. No acute
osseous abnormality identified.
IMPRESSION: No acute cardiopulmonary abnormality.

## 2021-11-07 ENCOUNTER — Other Ambulatory Visit: Payer: Self-pay

## 2021-11-07 ENCOUNTER — Other Ambulatory Visit (HOSPITAL_COMMUNITY)
Admission: EM | Admit: 2021-11-07 | Discharge: 2021-11-11 | Disposition: A | Discharge status: Other Acute Inpatient Hospital | Payer: No Payment, Other | Attending: Psychiatry | Admitting: Psychiatry

## 2021-11-07 DIAGNOSIS — F1410 Cocaine abuse, uncomplicated: Secondary | ICD-10-CM | POA: Diagnosis not present

## 2021-11-07 DIAGNOSIS — F1210 Cannabis abuse, uncomplicated: Secondary | ICD-10-CM | POA: Diagnosis not present

## 2021-11-07 DIAGNOSIS — F1190 Opioid use, unspecified, uncomplicated: Secondary | ICD-10-CM | POA: Diagnosis present

## 2021-11-07 DIAGNOSIS — Z20822 Contact with and (suspected) exposure to covid-19: Secondary | ICD-10-CM | POA: Diagnosis not present

## 2021-11-07 DIAGNOSIS — F1121 Opioid dependence, in remission: Secondary | ICD-10-CM | POA: Diagnosis not present

## 2021-11-07 DIAGNOSIS — Z9151 Personal history of suicidal behavior: Secondary | ICD-10-CM | POA: Insufficient documentation

## 2021-11-07 LAB — LIPID PANEL
Cholesterol: 180 mg/dL (ref 0–200)
HDL: 37 mg/dL — ABNORMAL LOW (ref >40)
LDL Cholesterol: 93 mg/dL (ref 0–99)
Total CHOL/HDL Ratio: 4.9 RATIO
Triglycerides: 251 mg/dL — ABNORMAL HIGH (ref <150)
VLDL: 50 mg/dL — ABNORMAL HIGH (ref 0–40)

## 2021-11-07 LAB — CBC WITH DIFFERENTIAL/PLATELET
Abs Immature Granulocytes: 0.02 10*3/uL (ref 0.00–0.07)
Basophils Absolute: 0.1 10*3/uL (ref 0.0–0.1)
Basophils Relative: 1 %
Eosinophils Absolute: 0.6 10*3/uL — ABNORMAL HIGH (ref 0.0–0.5)
Eosinophils Relative: 7 %
HCT: 45.9 % (ref 39.0–52.0)
Hemoglobin: 15.4 g/dL (ref 13.0–17.0)
Immature Granulocytes: 0 %
Lymphocytes Relative: 24 %
Lymphs Abs: 2.1 10*3/uL (ref 0.7–4.0)
MCH: 31.8 pg (ref 26.0–34.0)
MCHC: 33.6 g/dL (ref 30.0–36.0)
MCV: 94.6 fL (ref 80.0–100.0)
Monocytes Absolute: 0.5 10*3/uL (ref 0.1–1.0)
Monocytes Relative: 6 %
Neutro Abs: 5.4 10*3/uL (ref 1.7–7.7)
Neutrophils Relative %: 62 %
Platelets: 238 10*3/uL (ref 150–400)
RBC: 4.85 MIL/uL (ref 4.22–5.81)
RDW: 13 % (ref 11.5–15.5)
WBC: 8.8 10*3/uL (ref 4.0–10.5)
nRBC: 0 % (ref 0.0–0.2)

## 2021-11-07 LAB — COMPREHENSIVE METABOLIC PANEL
ALT: 19 U/L (ref 0–44)
AST: 16 U/L (ref 15–41)
Albumin: 4.2 g/dL (ref 3.5–5.0)
Alkaline Phosphatase: 59 U/L (ref 38–126)
Anion gap: 7 (ref 5–15)
BUN: 23 mg/dL — ABNORMAL HIGH (ref 6–20)
CO2: 30 mmol/L (ref 22–32)
Calcium: 9.6 mg/dL (ref 8.9–10.3)
Chloride: 104 mmol/L (ref 98–111)
Creatinine, Ser: 1.14 mg/dL (ref 0.61–1.24)
GFR, Estimated: >60 mL/min (ref >60)
Glucose, Bld: 83 mg/dL (ref 70–99)
Potassium: 5 mmol/L (ref 3.5–5.1)
Sodium: 141 mmol/L (ref 135–145)
Total Bilirubin: 0.3 mg/dL (ref 0.3–1.2)
Total Protein: 7 g/dL (ref 6.5–8.1)

## 2021-11-07 LAB — ETHANOL: Alcohol, Ethyl (B): <10 mg/dL (ref <10)

## 2021-11-07 LAB — POCT URINE DRUG SCREEN - MANUAL ENTRY (I-SCREEN)
POC Amphetamine UR: NOT DETECTED
POC Buprenorphine (BUP): NOT DETECTED
POC Cocaine UR: POSITIVE — AB
POC Marijuana UR: POSITIVE — AB
POC Methadone UR: NOT DETECTED
POC Methamphetamine UR: NOT DETECTED
POC Morphine: POSITIVE — AB
POC Oxazepam (BZO): NOT DETECTED
POC Oxycodone UR: NOT DETECTED
POC Secobarbital (BAR): NOT DETECTED

## 2021-11-07 LAB — POC SARS CORONAVIRUS 2 AG: SARSCOV2ONAVIRUS 2 AG: NEGATIVE

## 2021-11-07 LAB — MAGNESIUM: Magnesium: 2.1 mg/dL (ref 1.7–2.4)

## 2021-11-07 LAB — HEMOGLOBIN A1C
Hgb A1c MFr Bld: 5.4 % (ref 4.8–5.6)
Mean Plasma Glucose: 108.28 mg/dL

## 2021-11-07 LAB — TSH: TSH: 1.472 u[IU]/mL (ref 0.350–4.500)

## 2021-11-07 LAB — RESP PANEL BY RT-PCR (FLU A&B, COVID) ARPGX2
Influenza A by PCR: NEGATIVE
Influenza B by PCR: NEGATIVE
SARS Coronavirus 2 by RT PCR: NEGATIVE

## 2021-11-07 MED ORDER — METHOCARBAMOL 500 MG PO TABS
500.0000 mg | ORAL_TABLET | Freq: Three times a day (TID) | ORAL | Status: DC | PRN
  Administered 2021-11-09 – 2021-11-11 (×5): 500 mg via ORAL
  Filled 2021-11-07 (×5): qty 1

## 2021-11-07 MED ORDER — CLONIDINE HCL 0.1 MG PO TABS: 0.1000 mg | ORAL_TABLET | Freq: Every day | ORAL | Status: DC

## 2021-11-07 MED ORDER — CLONIDINE HCL 0.1 MG PO TABS
0.1000 mg | ORAL_TABLET | Freq: Four times a day (QID) | ORAL | Status: DC
  Administered 2021-11-07: 0.1 mg via ORAL
  Filled 2021-11-07 (×2): qty 1

## 2021-11-07 MED ORDER — NAPROXEN 500 MG PO TABS
500.0000 mg | ORAL_TABLET | Freq: Two times a day (BID) | ORAL | Status: DC | PRN
  Administered 2021-11-09 – 2021-11-11 (×3): 500 mg via ORAL
  Filled 2021-11-07 (×3): qty 1

## 2021-11-07 MED ORDER — TRAZODONE HCL 50 MG PO TABS
50.0000 mg | ORAL_TABLET | Freq: Every evening | ORAL | Status: DC | PRN
  Administered 2021-11-07 – 2021-11-10 (×3): 50 mg via ORAL
  Filled 2021-11-07 (×3): qty 1

## 2021-11-07 MED ORDER — LOPERAMIDE HCL 2 MG PO CAPS: 2.0000 mg | ORAL_CAPSULE | ORAL | Status: DC | PRN

## 2021-11-07 MED ORDER — CLONIDINE HCL 0.1 MG PO TABS: 0.1000 mg | ORAL_TABLET | ORAL | Status: DC

## 2021-11-07 MED ORDER — NICOTINE 14 MG/24HR TD PT24
14.0000 mg | MEDICATED_PATCH | Freq: Every day | TRANSDERMAL | Status: DC
  Administered 2021-11-07 – 2021-11-11 (×5): 14 mg via TRANSDERMAL
  Filled 2021-11-07 (×5): qty 1

## 2021-11-07 MED ORDER — ONDANSETRON 4 MG PO TBDP: 4.0000 mg | ORAL_TABLET | Freq: Four times a day (QID) | ORAL | Status: DC | PRN

## 2021-11-07 MED ORDER — ALUM & MAG HYDROXIDE-SIMETH 200-200-20 MG/5ML PO SUSP: 30.0000 mL | ORAL | Status: DC | PRN

## 2021-11-07 MED ORDER — DICYCLOMINE HCL 20 MG PO TABS
20.0000 mg | ORAL_TABLET | Freq: Four times a day (QID) | ORAL | Status: DC | PRN
  Administered 2021-11-08 – 2021-11-11 (×4): 20 mg via ORAL
  Filled 2021-11-07 (×4): qty 1

## 2021-11-07 MED ORDER — HYDROXYZINE HCL 25 MG PO TABS
25.0000 mg | ORAL_TABLET | Freq: Three times a day (TID) | ORAL | Status: DC | PRN
  Administered 2021-11-09 – 2021-11-11 (×6): 25 mg via ORAL
  Filled 2021-11-07 (×6): qty 1

## 2021-11-07 MED ORDER — ACETAMINOPHEN 325 MG PO TABS
650.0000 mg | ORAL_TABLET | Freq: Four times a day (QID) | ORAL | Status: DC | PRN
  Administered 2021-11-10 – 2021-11-11 (×3): 650 mg via ORAL
  Filled 2021-11-07 (×3): qty 2

## 2021-11-07 MED ORDER — MAGNESIUM HYDROXIDE 400 MG/5ML PO SUSP: 30.0000 mL | Freq: Every day | ORAL | Status: DC | PRN

## 2021-11-07 NOTE — Progress Notes (Signed)
Patient is awake, alert and oriented. Pt denies SI, HI, AVH and pain. Pt covid results are negative and has been accepted to [facility]. Informed patient of this fact and all questions answered.

## 2021-11-07 NOTE — ED Notes (Signed)
Pt sleeping@this time. Breathing even and unlabored. Will continue to monitor for safety 

## 2021-11-07 NOTE — ED Triage Notes (Signed)
Pt presents to [facility] accompanied by his sister and girlfriend seeking substance use treatment. Pt states that he uses opioids and fentanyl and last use was around 3am this morning, an unknown amount. Pt states when he is under the influence he experiences passive SI and paranoia. Pt denies any paranoia, delusional thinking and SI when he is sober. Pt denies SI/HI and AVH at this time.

## 2021-11-07 NOTE — ED Notes (Signed)
Pt admitted to [facility] seeking residential substance use treatment. Pt denies SI,HI,AVH. Patient was cooperative during the admission assessment. Skin assessment complete. Belongings in the locker. Patient oriented to unit and unit rules. Meal and drinks offered to patient.  Patient verbalized agreement to treatment plans. Patient verbally contracts for safety while hospitalized. Will monitor for safety.

## 2021-11-07 NOTE — ED Notes (Signed)
Patient arrived on unit.

## 2021-11-07 NOTE — ED Notes (Signed)
Pt asleep at this hour. No apparent distress. RR even and unlabored. Monitored for safety.  

## 2021-11-07 NOTE — ED Provider Notes (Signed)
Facility Based Crisis Admission H&P  Date: 11/07/21 Patient Name: Christopher Hughes MRN: [id number] Chief Complaint:  Chief Complaint  Patient presents with   Addiction Problem      Diagnoses:  Final diagnoses:  Opioid use disorder    HPI: Patient presents to [facility] behavioral health voluntarily for crisis assessment.  Patient's sister, Christopher Hughes, and his girlfriend, Christopher Hughes, remain present during assessment as patient requests.  Patient is seated in assessment area, no acute distress.  He is alert and oriented, pleasant and cooperative during assessment.  Patient is assessed face-to-face, by nurse practitioner.  He presents with euthymic mood, congruent affect.  Christopher Hughes is seeking residential substance use treatment today related to ongoing opioid use.  He typically uses 3 or more OxyContin 30 mg tablets per day. He is not prescribed opioids, buys them from street.   Most days he "snorts as many as I can get my hands on."  He last use opioids at 3 AM this morning.  He also smokes marijuana daily.  He last used marijuana 2 days ago.  He reports rare alcohol use, typically consumes approximately 1 drink per week.  Most recent alcohol use two weeks ago. He denies substance use aside from opioids, marijuana and alcohol.  Patient is insightful and committed to his sobriety.  He graduated [employer] program 8 years ago and had approximately 1.5 years clean time.   Christopher Hughes denies mental health diagnoses.  He denies current medications.  Family mental health history includes patient's sister has been diagnosed with PTSD and bipolar disorder.  He denies history of inpatient psychiatric hospitalization.  Patient denies suicidal and homicidal ideations today.  He easily contracts verbally for safety with this Clinical research associate.  He endorses suicidal ideation 2 weeks ago after "having a delusional dream."  He endorses 1 previous suicide attempt, attempted to hang himself 30 years ago.  He denies history of  nonsuicidal self-harm.  Patient denies auditory visual hallucinations currently.  He reports "after smoking and snorting I feel like I can see demons inside of me."  He reports typically feeling that he can see demons late at night after using increased amounts of opioids.  There is no evidence of delusional thought content and no indication that patient is responding to internal stimuli.  He denies symptoms of paranoia.  Christopher Hughes resides in [place] with his girlfriend.  He denies access to weapons.  He he is employed in the [employer].  Works on a part-time basis.  He reports typically sleeping approximately 6 hours per night.  He endorses decreased appetite.  Reports he has lost approximately 20 pounds over the last 6 weeks related to increased opioid use and decreased appetite.  Patient typically smokes cigarettes every day.  Would like nicotine patch.  Patient agrees with plan for admission to facility based crisis unit while awaiting residential substance use treatment.  Reviewed medications including clonidine, discussed side effects and offered patient opportunity to ask questions.  Patient agrees with plan to initiate clonidine taper.  Patient offered support and encouragement.  PHQ 2-9:   Flowsheet Row ED from 11/07/2021 in [facility]  C-SSRS RISK CATEGORY No Risk        Total Time spent with patient: 30 minutes  Musculoskeletal  Strength & Muscle Tone: within normal limits Gait & Station: normal Patient leans: N/A  Psychiatric Specialty Exam  Presentation General Appearance: Appropriate for Environment; Casual  Eye Contact:Good  Speech:Clear and Coherent; Normal Rate  Speech Volume:Normal  Handedness:Right  Mood and Affect  Mood:Euthymic  Affect:Appropriate; Congruent   Thought Process  Thought Processes:Coherent; Goal Directed; Linear  Descriptions of Associations:Intact  Orientation:Full (Time, Place and  Person)  Thought Content:Logical; WDL    Hallucinations:Hallucinations: None  Ideas of Reference:None  Suicidal Thoughts:Suicidal Thoughts: No  Homicidal Thoughts:Homicidal Thoughts: No   Sensorium  Memory:Immediate Good; Recent Good  Judgment:Good  Insight:Good   Executive Functions  Concentration:Good  Attention Span:Good  Recall:Good  Fund of Knowledge:Good  Language:Good   Psychomotor Activity  Psychomotor Activity:Psychomotor Activity: Normal   Assets  Assets:Communication Skills; Desire for Improvement; Financial Resources/Insurance; Housing; Intimacy; Leisure Time; Physical Health; Resilience; Social Support   Sleep  Sleep:Sleep: Fair   Nutritional Assessment (For OBS and FBC admissions only) Has the patient had a weight loss or gain of 10 pounds or more in the last 3 months?: Yes Has the patient had a decrease in food intake/or appetite?: Yes Does the patient have dental problems?: No Does the patient have eating habits or behaviors that may be indicators of an eating disorder including binging or inducing vomiting?: No Has the patient recently lost weight without trying?: 2 Has the patient been eating poorly because of a decreased appetite?: 0 Malnutrition Screening Tool Score: 2 Nutritional Assessment Referrals: Medication/Tx changes    Physical Exam Vitals and nursing note reviewed.  Constitutional:      Appearance: Normal appearance. He is well-developed and normal weight.  HENT:     Head: Normocephalic and atraumatic.     Nose: Nose normal.  Cardiovascular:     Rate and Rhythm: Normal rate.  Pulmonary:     Effort: Pulmonary effort is normal.  Musculoskeletal:        General: Normal range of motion.     Cervical back: Normal range of motion.  Skin:    General: Skin is warm and dry.  Neurological:     Mental Status: He is alert and oriented to person, place, and time.  Psychiatric:        Attention and Perception: Attention and  perception normal.        Mood and Affect: Mood and affect normal.        Speech: Speech normal.        Behavior: Behavior normal. Behavior is cooperative.        Thought Content: Thought content normal.        Cognition and Memory: Cognition and memory normal.        Judgment: Judgment normal.    Review of Systems  Constitutional:  Positive for weight loss.  HENT: Negative.    Eyes: Negative.   Respiratory: Negative.    Cardiovascular: Negative.   Gastrointestinal: Negative.   Genitourinary: Negative.   Musculoskeletal: Negative.   Skin: Negative.   Neurological: Negative.   Psychiatric/Behavioral:  Positive for substance abuse.     Blood pressure (!) 123/90, pulse 100, temperature 98.1 F (36.7 C), temperature source Oral, resp. rate 18, SpO2 94 %. There is no height or weight on file to calculate BMI.  Past Psychiatric History: Cocaine use disorder, opioid use disorder  Is the patient at risk to self? No  Has the patient been a risk to self in the past 6 months? No .    Has the patient been a risk to self within the distant past? Yes   Is the patient a risk to others? No   Has the patient been a risk to others in the past 6 months? No   Has the patient  been a risk to others within the distant past? No   Past Medical History:  Past Medical History:  Diagnosis Date   Carpal tunnel syndrome    Herniated cervical disc    No past surgical history on file.  Family History: No family history on file.  Social History:  Social History   Socioeconomic History   Marital status: Single    Spouse name: Not on file   Number of children: Not on file   Years of education: Not on file   Highest education level: Not on file  Occupational History   Not on file  Tobacco Use   Smoking status: Every Day    Packs/day: 0.50    Types: Cigarettes   Smokeless tobacco: Never  Vaping Use   Vaping Use: Former  Substance and Sexual Activity   Alcohol use: Yes    Alcohol/week: 35.0  standard drinks of alcohol    Types: 35 Cans of beer per week    Comment: pt drinks 1/5 of liquor a day   Drug use: Yes   Sexual activity: Not on file  Other Topics Concern   Not on file  Social History Narrative   Not on file   Social Determinants of Health   Financial Resource Strain: Not on file  Food Insecurity: Not on file  Transportation Needs: Not on file  Physical Activity: Not on file  Stress: Not on file  Social Connections: Not on file  Intimate Partner Violence: Not on file    SDOH:  SDOH Screenings   Alcohol Screen: Not on file  Depression (PHQ2-9): Not on file  Financial Resource Strain: Not on file  Food Insecurity: Not on file  Housing: Not on file  Physical Activity: Not on file  Social Connections: Not on file  Stress: Not on file  Tobacco Use: High Risk (03/29/2020)   Patient History    Smoking Tobacco Use: Every Day    Smokeless Tobacco Use: Never    Passive Exposure: Not on file  Transportation Needs: Not on file    Last Labs:  No visits with results within 6 Month(s) from this visit.  Latest known visit with results is:  Admission on 07/23/2018, Discharged on 07/23/2018  Component Date Value Ref Range Status   WBC 07/23/2018 9.7  4.0 - 10.5 K/uL Final   RBC 07/23/2018 4.79  4.22 - 5.81 MIL/uL Final   Hemoglobin 07/23/2018 15.3  13.0 - 17.0 g/dL Final   HCT 93/79/0240 45.4  39.0 - 52.0 % Final   MCV 07/23/2018 94.8  80.0 - 100.0 fL Final   MCH 07/23/2018 31.9  26.0 - 34.0 pg Final   MCHC 07/23/2018 33.7  30.0 - 36.0 g/dL Final   RDW 97/35/3299 12.9  11.5 - 15.5 % Final   Platelets 07/23/2018 268  150 - 400 K/uL Final   nRBC 07/23/2018 0.0  0.0 - 0.2 % Final   Performed at [facility], [address], [place], [place] [address]   Sodium 07/23/2018 140  135 - 145 mmol/L Final   Potassium 07/23/2018 4.1  3.5 - 5.1 mmol/L Final   Chloride 07/23/2018 106  98 - 111 mmol/L Final   CO2 07/23/2018 22  22 - 32 mmol/L Final   Glucose, Bld  07/23/2018 131 (H)  70 - 99 mg/dL Final   BUN 34/19/6222 17  6 - 20 mg/dL Final   Creatinine, Ser 07/23/2018 1.23  0.61 - 1.24 mg/dL Final   Calcium 97/98/9211 9.3  8.9 - 10.3 mg/dL Final   GFR calc non Af Amer 07/23/2018 >60  >60 mL/min Final   GFR calc Af Amer 07/23/2018 >60  >60 mL/min Final   Anion gap 07/23/2018 12  5 - 15 Final   Performed at [facility], [address] [place], [place] [address]   Troponin I 07/23/2018 <0.03  <0.03 ng/mL Final   Performed at [facility], [address], [place], [place] [address]    Allergies: Codeine  PTA Medications: (Not in a hospital admission)   Long Term Goals: Improvement in symptoms so as ready for discharge  Short Term Goals: Patient will verbalize feelings in meetings with treatment team members., Patient will attend at least of 50% of the groups daily., Pt will complete the PHQ9 on admission, day 3 and discharge., Patient will participate in completing the [place] Suicide Severity Rating Scale, Patient will score a low risk of violence for 24 hours prior to discharge, and Patient will take medications as prescribed daily.  Medical Decision Making  Patient reviewed with Dr. Nelly Rout.  He remains voluntary at this time, agrees with plan for admission to facility based crisis unit while awaiting residential substance use treatment.  Laboratory studies ordered including CBC, CMP, ethanol, A1c, hepatic function, lipid panel, magnesium, prolactin and TSH.  Urine drug screen order initiated.  EKG ordered.  Current medications: -Acetaminophen 650 mg every 6 as needed/mild pain -Maalox 30 mL oral every 4 as needed/digestion -Hydroxyzine 25 mg 3 times daily as needed/anxiety -Magnesium hydroxide 30 mL daily as needed/mild constipation -Trazodone 50 mg nightly as needed/sleep -NicoDerm nicotine patch 14 mg daily transdermal/nicotine withdrawal  Clonidine Detox protocol initiated: -Clonidine 0.1 mg 4 daily x10 doses,  clonidine 0.1 mg every morning and nightly x4 doses, clonidine 0.1 mg daily before breakfast x2 doses -Dicyclomine 20 mg every 6 hours as needed/spasms or abdominal cramping -Loperamide 2 to 4 mg oral as needed/diarrhea or loose stools -Methocarbamol 500 mg every 8 hours as needed/muscle spasms -Naproxen 500 mg twice daily as needed/aching, pain or discomfort -Ondansetron disintegrating tablet 4mg  every 8 hours as needed/nausea or vomiting    Recommendations  Based on my evaluation the patient does not appear to have an emergency medical condition.  , FNP 11/07/21  5:47 PM

## 2021-11-07 NOTE — ED Notes (Signed)
Patient present to [facility] is seeking residential substance use treatment today related to ongoing opioid use.  He typically uses 3 or more OxyContin 30 mg tablets per day. He is not prescribed opioids, buys them from street.   Most days he "snorts as many as I can get my hands on."  He last use opioids at 3 AM this morning.  He also smokes marijuana daily.  Patient denies SI/HI and AVH. Patient A&Ox4. Patient denies any physical complaints when asked. No acute distress noted. Support and encouragement provided. Routine safety checks conducted according to facility protocol. Encouraged patient to notify staff if thoughts of harm toward self or others arise. Patient verbalize understanding and agreement. Will continue to monitor for safety.

## 2021-11-08 ENCOUNTER — Encounter (HOSPITAL_COMMUNITY): Payer: Self-pay | Admitting: Family

## 2021-11-08 ENCOUNTER — Encounter (HOSPITAL_COMMUNITY): Payer: Self-pay

## 2021-11-08 DIAGNOSIS — Z20822 Contact with and (suspected) exposure to covid-19: Secondary | ICD-10-CM | POA: Diagnosis not present

## 2021-11-08 DIAGNOSIS — F1210 Cannabis abuse, uncomplicated: Secondary | ICD-10-CM | POA: Diagnosis not present

## 2021-11-08 DIAGNOSIS — F1121 Opioid dependence, in remission: Secondary | ICD-10-CM | POA: Diagnosis not present

## 2021-11-08 DIAGNOSIS — F1410 Cocaine abuse, uncomplicated: Secondary | ICD-10-CM | POA: Diagnosis not present

## 2021-11-08 MED ORDER — FLUTICASONE PROPIONATE 50 MCG/ACT NA SUSP
1.0000 | Freq: Every day | NASAL | Status: DC
Start: 2021-11-08 — End: 2021-11-11
  Administered 2021-11-08 – 2021-11-11 (×4): 1 via NASAL
  Filled 2021-11-08 (×2): qty 16

## 2021-11-08 MED ORDER — POLYETHYLENE GLYCOL 3350 17 G PO PACK
17.0000 g | PACK | Freq: Every day | ORAL | Status: DC
  Administered 2021-11-08 – 2021-11-10 (×3): 17 g via ORAL
  Filled 2021-11-08 (×4): qty 1

## 2021-11-08 MED ORDER — CLONIDINE HCL 0.1 MG PO TABS: 0.1000 mg | ORAL_TABLET | ORAL | Status: DC

## 2021-11-08 MED ORDER — CLONIDINE HCL 0.1 MG PO TABS: 0.1000 mg | ORAL_TABLET | Freq: Every day | ORAL | Status: DC

## 2021-11-08 MED ORDER — SENNOSIDES-DOCUSATE SODIUM 8.6-50 MG PO TABS
1.0000 | ORAL_TABLET | Freq: Every day | ORAL | Status: DC
  Administered 2021-11-08 – 2021-11-10 (×3): 1 via ORAL
  Filled 2021-11-08 (×4): qty 1

## 2021-11-08 MED ORDER — CLONIDINE HCL 0.1 MG PO TABS
0.1000 mg | ORAL_TABLET | Freq: Two times a day (BID) | ORAL | Status: DC | PRN
  Administered 2021-11-09 – 2021-11-11 (×4): 0.1 mg via ORAL
  Filled 2021-11-08 (×4): qty 1

## 2021-11-08 MED ORDER — CLONIDINE HCL 0.1 MG PO TABS
0.1000 mg | ORAL_TABLET | Freq: Four times a day (QID) | ORAL | Status: DC
  Administered 2021-11-08: 0.1 mg via ORAL

## 2021-11-08 NOTE — ED Notes (Signed)
Pt is in the dayroom at this hour. No apparent distress. RR even and unlabored. Monitored for safety.

## 2021-11-08 NOTE — ED Notes (Signed)
Pt is in the bed sleeping. Respirations are even and unlabored. No acute distress noted. Will continue to monitor for safety. 

## 2021-11-08 NOTE — ED Notes (Signed)
Pt did not attend AA group  

## 2021-11-08 NOTE — ED Provider Notes (Signed)
FBC Progress Note  Date and Time: 11/08/2021 10:05 AM Name: Christopher Hughes MRN:  [id number]  Reason For Admission: Opiate addiction  Subjective:   Patient seen and assessed at bedside.  Patient denies SI/HI/AVH patient reports that he "got messed up" and had substance-induced visual hallucinations 1-1/2 weeks ago.  Patient reports that he also started having cocaine more recently and realized that he needs to get help.  Willing to go to substance rehab if it is in [place].  LCSW to assist with this.  Patient reports feeling congested as well as constipated likely secondary to opiate use.  Amenable to starting naltrexone on 11/11/2021, Flonase for nasal congestion, and bowel regimen to aid with opiate induced constipation.  Uninterested in psychotropics as he does not have a mood disorder and denies having anxiety at this time.  Patient is made aware of available PRNs and stated he would ask for them if required.   Diagnosis:  Final diagnoses:  Opioid use disorder    Total Time spent with patient: 45 minutes   Labs  Lab Results:     Latest Ref Rng & Units 11/07/2021    6:08 PM 07/23/2018    3:51 AM 03/29/2018   10:30 PM  CBC  WBC 4.0 - 10.5 K/uL 8.8  9.7  8.5   Hemoglobin 13.0 - 17.0 g/dL 08.6  76.1  95.0   Hematocrit 39.0 - 52.0 % 45.9  45.4  49.5   Platelets 150 - 400 K/uL 238  268  256       Latest Ref Rng & Units 11/07/2021    6:08 PM 07/23/2018    3:51 AM 03/29/2018   10:30 PM  CMP  Glucose 70 - 99 mg/dL 83  932  671   BUN 6 - 20 mg/dL 23  17  19    Creatinine 0.61 - 1.24 mg/dL  2.45  8.09   Sodium 135 - 145 mmol/L 141  140  139   Potassium 3.5 - 5.1 mmol/L 5.0  4.1  3.9   Chloride 98 - 111 mmol/L 104  106  106   CO2 22 - 32 mmol/L 30  22  24    Calcium 8.9 - 10.3 mg/dL 9.6  9.3  9.1   Total Protein 6.5 - 8.1 g/dL 7.0     Total Bilirubin 0.3 - 1.2 mg/dL 0.3     Alkaline Phos 38 - 126 U/L 59     AST 15 - 41 U/L 16     ALT 0 - 44 U/L 19       Physical  Findings   PHQ2-9    Flowsheet Row ED from 11/07/2021 in [facility]  PHQ-2 Total Score 0      Flowsheet Row ED from 11/07/2021 in [facility]  C-SSRS RISK CATEGORY No Risk        Musculoskeletal  Strength & Muscle Tone: within normal limits Gait & Station: normal Patient leans: N/A  Psychiatric Specialty Exam  Presentation  General Appearance: Appropriate for Environment; Casual   Eye Contact:Good   Speech:Clear and Coherent; Normal Rate   Speech Volume:Normal   Handedness:Right    Mood and Affect  Mood:Euthymic   Affect:Appropriate; Congruent    Thought Process  Thought Processes:Coherent; Goal Directed; Linear   Descriptions of Associations:Intact   Orientation:Full (Time, Place and Person)   Thought Content:Logical; WDL   Diagnosis of Schizophrenia or Schizoaffective disorder in past: No data recorded    Hallucinations:Hallucinations:  None   Ideas of Reference:None   Suicidal Thoughts:Suicidal Thoughts: No   Homicidal Thoughts:Homicidal Thoughts: No    Sensorium  Memory:Immediate Good; Recent Good   Judgment:Good   Insight:Good    Executive Functions  Concentration:Good   Attention Span:Good   Recall:Good   Fund of Knowledge:Good   Language:Good    Psychomotor Activity  Psychomotor Activity:Psychomotor Activity: Normal    Assets  Assets:Communication Skills; Desire for Improvement; Financial Resources/Insurance; Housing; Intimacy; Leisure Time; Physical Health; Resilience; Social Support    Sleep  Sleep:Sleep: Fair    Physical Exam  Physical Exam Vitals and nursing note reviewed.  Constitutional:      General: He is not in acute distress.    Appearance: He is well-developed.  HENT:     Head: Normocephalic and atraumatic.     Nose: Congestion present.  Eyes:     Conjunctiva/sclera: Conjunctivae normal.  Cardiovascular:     Rate and  Rhythm: Normal rate and regular rhythm.     Heart sounds: No murmur heard. Pulmonary:     Effort: Pulmonary effort is normal. No respiratory distress.     Breath sounds: Normal breath sounds.  Abdominal:     Palpations: Abdomen is soft.     Tenderness: There is no abdominal tenderness.  Musculoskeletal:        General: No swelling.     Cervical back: Neck supple.  Skin:    General: Skin is warm and dry.     Capillary Refill: Capillary refill takes less than 2 seconds.  Neurological:     Mental Status: He is alert.  Psychiatric:        Mood and Affect: Mood normal.    Review of Systems  HENT:  Positive for congestion.   Respiratory:  Negative for shortness of breath.   Cardiovascular:  Negative for chest pain.  Gastrointestinal:  Negative for abdominal pain, constipation, diarrhea, heartburn, nausea and vomiting.  Neurological:  Negative for headaches.   Blood pressure 107/70, pulse 71, temperature 98.3 F (36.8 C), temperature source Oral, resp. rate 18, SpO2 97 %. There is no height or weight on file to calculate BMI.  ASSESSMENT Christopher Hughes is a 56 y.o. male with PMHx of opiate use disorder, cocaine use disorder, substance-induced psychosis, cannabis use disorder presenting to [facility] for substance detox as well as residential substance rehab.  PLAN Polysubstance abuse Opiate use disorder Stimulant use disorder-cocaine Cannabis use disorder -Opiate withdrawal PRNs available -Consider starting naltrexone 11/10/2021 for opiate dependence (LFTs normal) -Monitor for further withdrawal symptoms with COWS -Discontinue clonidine taper as patient's blood pressure low normal -Encourage patient to cease all substance use  Opiate-induced constipation -MiraLAX and Senokot  Nicotine use disorder -Nicotine patch for NRT  Dispo: LCSW to assist with [employer] application   Christopher Pope, MD 11/08/2021 10:05 AM

## 2021-11-08 NOTE — Clinical Social Work Psych Note (Signed)
LCSW Update Note  LCSW met with Kayne for introduction and to begin discussions regarding treatment and potential discharge planning.   Masaji presented with a dysphoric affect, congruent mood, however he was pleasant and cooperative with providing information. Maejor denied having any SI, HI or AVH at this time.   [place] shared that he presented to the [facility] seeking detox services for his ongoing opiate use. Reshawn endorsed taking 3, 73m OxyCotin pills on a daily basis for the last 8 years. AOlsonreports the reason he takes the OxyCotin is due to having a "bad back".   According to the patient's HPI, "[facility]  Patient's sister, VDerl Barrow and his girlfriend, DArrie Aran remain present during assessment as patient requests.Patient is seated in assessment area, no acute distress.  He is alert and oriented, pleasant and cooperative during assessment.  Patient is assessed face-to-face, by nurse practitioner.  He presents with euthymic mood, congruent affect.AWaiis seeking residential substance use treatment today related to ongoing opioid use.  He typically uses 3 or more OxyContin 30 mg tablets per day. He is not prescribed opioids, buys them from street.   Most days he "snorts as many as I can get my hands on."  He last use opioids at 3 AM this morning.  He also smokes marijuana daily.  He last used marijuana 2 days ago.  He reports rare alcohol use, typically consumes approximately 1 drink per week.  Most recent alcohol use two weeks ago. He denies substance use aside from opioids, marijuana and alcohol.Patient is insightful and committed to his sobriety.  He graduated [employer] program 8 years ago and had approximately 1.5 years clean time.    AYojandenies mental health diagnoses.  He denies current medications.  Family mental health history includes patient's sister has been diagnosed with PTSD and bipolar disorder.  He  denies history of inpatient psychiatric hospitalization.Patient denies suicidal and homicidal ideations today.  He easily contracts verbally for safety with this wProbation officer  He endorses suicidal ideation 2 weeks ago after "having a delusional dream."  He endorses 1 previous suicide attempt, attempted to hang himself 30 years ago.  He denies history of nonsuicidal self-harm.Patient denies auditory visual hallucinations currently.  He reports "after smoking and snorting I feel like I can see demons inside of me."  He reports typically feeling that he can see demons late at night after using increased amounts of opioids.  There is no evidence of delusional thought content and no indication that patient is responding to internal stimuli.  He denies symptoms of paranoia.ACl[place].  He denies access to weapons.  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  He reports typically sleeping approximately 6 hours per night.  He endorses decreased appetite.  Reports he has lost approximately 20 pounds over the last 6 weeks related to increased opioid use and decreased appetite".  AJevonteshared with LCSW that he is interested in residential treatment services [facility]   AAzavierrequested [facility]. ADamyandid not have any additional questions or concerns at this time.   LCSW has sent the patient's information to [facility] admissions department for review.   LCSW will continue to follow.   JRadonna Ricker MSW, LCSW Clinical SEducation officer, museum([facility]

## 2021-11-08 NOTE — ED Notes (Signed)
Pt resting at this hour. No apparent distress. RR even and unlabored. Monitored for safety.  

## 2021-11-08 NOTE — BH IP Treatment Plan (Addendum)
Interdisciplinary Treatment and Diagnostic Plan Update  11/08/2021 Time of Session: 10:00AM  Christopher Hughes MRN: [id number]  Diagnosis:  Final diagnoses:  Opioid dependence in remission (HCC)  Cocaine abuse (HCC)  Cannabis abuse     Current Medications:  Current Facility-Administered Medications  Medication Dose Route Frequency Provider Last Rate Last Admin   acetaminophen (TYLENOL) tablet 650 mg  650 mg Oral Q6H PRN Lenard Lance, FNP       alum & mag hydroxide-simeth (MAALOX/MYLANTA) 200-200-20 MG/5ML suspension 30 mL  30 mL Oral Q4H PRN Lenard Lance, FNP       cloNIDine (CATAPRES) tablet 0.1 mg  0.1 mg Oral BID PRN Park Pope, MD       dicyclomine (BENTYL) tablet 20 mg  20 mg Oral Q6H PRN Lenard Lance, FNP       fluticasone (FLONASE) 50 MCG/ACT nasal spray 1 spray  1 spray Each Nare Daily Park Pope, MD   1 spray at 11/08/21 1121   hydrOXYzine (ATARAX) tablet 25 mg  25 mg Oral TID PRN Lenard Lance, FNP       loperamide (IMODIUM) capsule 2-4 mg  2-4 mg Oral PRN Lenard Lance, FNP       magnesium hydroxide (MILK OF MAGNESIA) suspension 30 mL  30 mL Oral Daily PRN Lenard Lance, FNP       methocarbamol (ROBAXIN) tablet 500 mg  500 mg Oral Q8H PRN Lenard Lance, FNP       naproxen (NAPROSYN) tablet 500 mg  500 mg Oral BID PRN Lenard Lance, FNP       nicotine (NICODERM CQ - dosed in mg/24 hours) patch 14 mg  14 mg Transdermal Daily Lenard Lance, FNP   14 mg at 11/08/21 0949   ondansetron (ZOFRAN-ODT) disintegrating tablet 4 mg  4 mg Oral Q6H PRN Lenard Lance, FNP       polyethylene glycol (MIRALAX / GLYCOLAX) packet 17 g  17 g Oral Daily Park Pope, MD   17 g at 11/08/21 1121   senna-docusate (Senokot-S) tablet 1 tablet  1 tablet Oral Daily Park Pope, MD   1 tablet at 11/08/21 1121   traZODone (DESYREL) tablet 50 mg  50 mg Oral QHS PRN Lenard Lance, FNP   50 mg at 11/07/21 2133   Current Outpatient Medications  Medication Sig Dispense Refill   Phenylephrine-Acetaminophen  (TYLENOL SINUS CONGESTION/PAIN PO) Take 2 tablets by mouth every 4 (four) hours as needed (For sinus congestion. Do not take more than 10 in 24 hours).     PTA Medications: Prior to Admission medications   Medication Sig Start Date End Date Taking? Authorizing Provider  Phenylephrine-Acetaminophen (TYLENOL SINUS CONGESTION/PAIN PO) Take 2 tablets by mouth every 4 (four) hours as needed (For sinus congestion. Do not take more than 10 in 24 hours).   Yes [provider]    Patient Stressors: Occupational concerns   Substance abuse    Patient Strengths: Motivation for treatment/growth   Treatment Modalities: Medication Management, Group therapy, Case management,  1 to 1 session with clinician, Psychoeducation, Recreational therapy.   Physician Treatment Plan for Primary and Secondary Diagnosis:  Final diagnoses:  Opioid dependence in remission (HCC)  Cocaine abuse (HCC)  Cannabis abuse   Long Term Goal(s): Improvement in symptoms so as ready for discharge  Short Term Goals: Patient will verbalize feelings in meetings with treatment team members. Patient will attend at least of 50% of the groups daily. Pt  will complete the PHQ9 on admission, day 3 and discharge. Patient will participate in completing the [place] Suicide Severity Rating Scale Patient will score a low risk of violence for 24 hours prior to discharge Patient will take medications as prescribed daily.  Medication Management: Evaluate patient's response, side effects, and tolerance of medication regimen.  Therapeutic Interventions: 1 to 1 sessions, Unit Group sessions and Medication administration.  Evaluation of Outcomes: Progressing  LCSW Treatment Plan for Primary Diagnosis:  Final diagnoses:  Opioid dependence in remission (HCC)  Cocaine abuse (HCC)  Cannabis abuse    Long Term Goal(s): Safe transition to appropriate next level of care at discharge.  Short Term Goals: Facilitate acceptance of  mental health diagnosis and concerns through verbal commitment to aftercare plan and appointments at discharge. and Identify minimum of 2 triggers associated with mental health/substance abuse issues with treatment team members.  Therapeutic Interventions: Assess for all discharge needs, 1 to 1 time with Child psychotherapist, Explore available resources and support systems, Assess for adequacy in community support network, Educate family and significant other(s) on suicide prevention, Complete Psychosocial Assessment, Interpersonal group therapy.  Evaluation of Outcomes: Progressing   Progress in Treatment: Attending groups: Yes. Participating in groups: Yes. Taking medication as prescribed: Yes. Toleration medication: Yes. Family/Significant other contact made: No, will contact:  no one at this time Patient understands diagnosis: Yes. Discussing patient identified problems/goals with staff: Yes. Medical problems stabilized or resolved: Yes. Denies suicidal/homicidal ideation: Yes. Issues/concerns per patient self-inventory: No. Other: None   New problem(s) identified: No, Describe:  none   New Short Term/Long Term Goal(s): Christopher Hughes reports his long term goal is to remain sober.   Patient Goals: "Try to get clean"  Discharge Plan or Barriers: Christopher Hughes plans to discharge to a residential treatment facility, most likely [facility], if accepted for a screening appointment. LCSW will continue to follow for possible placement.   Reason for Continuation of Hospitalization: Depression Medication stabilization Withdrawal symptoms  Estimated Length of Stay: 3-5 days   Last 3 [place] Suicide Severity Risk Score: Flowsheet Row ED from 11/07/2021 in [facility]  C-SSRS RISK CATEGORY No Risk       Last PHQ 2/9 Scores:    11/07/2021    6:19 PM  Depression screen PHQ 2/9  Decreased Interest 0  Down, Depressed, Hopeless 0  PHQ - 2 Score 0     Scribe for Treatment Team: Maeola Sarah, LCSW 11/08/2021 12:07 PM

## 2021-11-08 NOTE — ED Notes (Signed)
Progress note   D: Pt seen in dayroom. Pt denies SI, HI, AVH. Pt rates pain  0/10. Pt rates anxiety  0/10 and depression  0/10. Pt endorses stomach cramping today. Pt says he feels constipated and was given some medication earlier in the day but no bowel movement yet. Normal routine is daily. LBM was yesterday. COWS = 3 d/t cramping and runny nose. Pt denies any other withdrawal symptoms.   A: Pt provided support and encouragement. Pt given scheduled medication as prescribed. PRNs as appropriate. Q15 min checks for safety.   R: Pt safe on the unit. Will continue to monitor.

## 2021-11-08 NOTE — ED Notes (Signed)
Pt is attending AA. Snacks offered.

## 2021-11-08 NOTE — BH Assessment (Signed)
Comprehensive Clinical Assessment (CCA) Note  11/08/2021 Christopher Hughes [id number]  Chief Complaint:  Chief Complaint  Patient presents with   Addiction Problem   Visit Diagnosis: Opioid use disorder  HPI: Patient presents to [facility] voluntarily for crisis assessment.  Patient's sister, Christopher Hughes Session, and his girlfriend, Christopher Hughes, remain present during assessment as patient requests.   Patient is seated in assessment area, no acute distress.  He is alert and oriented, pleasant and cooperative during assessment.  Patient is assessed face-to-face, by nurse practitioner.  He presents with euthymic mood, congruent affect.   Chevon is seeking residential substance use treatment today related to ongoing opioid use.  He typically uses 3 or more OxyContin 30 mg tablets per day. He is not prescribed opioids, buys them from street.   Most days he "snorts as many as I can get my hands on."  He last use opioids at 3 AM this morning.  He also smokes marijuana daily.  He last used marijuana 2 days ago.  He reports rare alcohol use, typically consumes approximately 1 drink per week.  Most recent alcohol use two weeks ago. He denies substance use aside from opioids, marijuana and alcohol.   Patient is insightful and committed to his sobriety.  He graduated [employer] program 8 years ago and had approximately 1.5 years clean time.    Christopher Hughes denies mental health diagnoses.  He denies current medications.  Family mental health history includes patient's sister has been diagnosed with PTSD and bipolar disorder.  He denies history of inpatient psychiatric hospitalization.   Patient denies suicidal and homicidal ideations today.  He easily contracts verbally for safety with this Clinical research associate.  He endorses suicidal ideation 2 weeks ago after "having a delusional dream."  He endorses 1 previous suicide attempt, attempted to hang himself 30 years ago.  He denies history of nonsuicidal self-harm.   Patient  denies auditory visual hallucinations currently.  He reports "after smoking and snorting I feel like I can see demons inside of me."  He reports typically feeling that he can see demons late at night after using increased amounts of opioids.  There is no evidence of delusional thought content and no indication that patient is responding to internal stimuli.  He denies symptoms of paranoia.   Christopher Hughes resides in [place] with his girlfriend.  He denies access to weapons.  He he is employed in the [employer].  Works on a part-time basis.  He reports typically sleeping approximately 6 hours per night.  He endorses decreased appetite.  Reports he has lost approximately 20 pounds over the last 6 weeks related to increased opioid use and decreased appetite.   Patient typically smokes cigarettes every day.  Would like nicotine patch.  Patient agrees with plan for admission to facility based crisis unit while awaiting residential substance use treatment.  Reviewed medications including clonidine, discussed side effects and offered patient opportunity to ask questions.  Patient agrees with plan to initiate clonidine taper.       CCA Screening, Triage and Referral (STR)  Patient Reported Information How did you hear about [place]? Family/Friend  What Is the Reason for Your Visit/Call Today? Pt presents to [facility] accompanied by his sister and girlfriend seeking substance use treatment. Pt states that he uses opioids and fentanyl and last use was around 3am this morning, an unknown amount. Pt states when he is under the influence he experiences passive SI and paranoia. Pt denies any paranoia, delusional thinking and SI when  he is sober. Pt denies SI/HI and AVH at this time.  How Long Has This Been Causing You Problems? <Week  What Do You Feel Would Help You the Most Today? Alcohol or Drug Use Treatment   Have You Recently Had Any Thoughts About Hurting Yourself? No  Are You Planning to Commit  Suicide/Harm Yourself At This time? No   Have you Recently Had Thoughts About Hurting Someone Christopher Hughes? No  Are You Planning to Harm Someone at This Time? No data recorded Explanation: No data recorded  Have You Used Any Alcohol or Drugs in the Past 24 Hours? Yes  How Long Ago Did You Use Drugs or Alcohol? No data recorded What Did You Use and How Much? fentanyl, opioids, unknown amount   Do You Currently Have a Therapist/Psychiatrist? No data recorded Name of Therapist/Psychiatrist: No data recorded  Have You Been Recently Discharged From Any Office Practice or Programs? No data recorded Explanation of Discharge From Practice/Program: No data recorded    CCA Screening Triage Referral Assessment Type of Contact: No data recorded Telemedicine Service Delivery:   Is this Initial or Reassessment? No data recorded Date Telepsych consult ordered in [facility]:  No data recorded Time Telepsych consult ordered in [facility]:  No data recorded Location of Assessment: No data recorded Provider Location: No data recorded  Collateral Involvement: No data recorded  Does Patient Have a Court Appointed Legal Guardian? No data recorded Name and Contact of Legal Guardian: No data recorded If Minor and Not Living with Parent(s), Who has Custody? No data recorded Is CPS involved or ever been involved? No data recorded Is APS involved or ever been involved? No data recorded  Patient Determined To Be At Risk for Harm To Self or Others Based on Review of Patient Reported Information or Presenting Complaint? No data recorded Method: No data recorded Availability of Means: No data recorded Intent: No data recorded Notification Required: No data recorded Additional Information for Danger to Others Potential: No data recorded Additional Comments for Danger to Others Potential: No data recorded Are There Guns or Other Weapons in Your Home? No data recorded Types of Guns/Weapons: No data recorded Are These Weapons  Safely Secured?                            No data recorded Who Could Verify You Are Able To Have These Secured: No data recorded Do You Have any Outstanding Charges, Pending Court Dates, Parole/Probation? No data recorded Contacted To Inform of Risk of Harm To Self or Others: No data recorded   Does Patient Present under Involuntary Commitment? No data recorded IVC Papers Initial File Date: No data recorded  [place] of Residence: No data recorded  Patient Currently Receiving the Following Services: No data recorded  Determination of Need: Routine (7 days)   Options For Referral: Outpatient Therapy; Medication Management     CCA Biopsychosocial Patient Reported Schizophrenia/Schizoaffective Diagnosis in Past: No   Strengths: No data recorded  Mental Health Symptoms Depression:   Irritability   Duration of Depressive symptoms:  Duration of Depressive Symptoms: Greater than two weeks   Mania:   None   Anxiety:    None   Psychosis:   None   Duration of Psychotic symptoms:    Trauma:   None   Obsessions:   None   Compulsions:   None   Inattention:   None   Hyperactivity/Impulsivity:   None   Oppositional/Defiant Behaviors:  None   Emotional Irregularity:   None   Other Mood/Personality Symptoms:  No data recorded   Mental Status Exam Appearance and self-care  Stature:   Average   Weight:   Average weight   Clothing:   Casual   Grooming:   Normal   Cosmetic use:   None   Posture/gait:   Normal   Motor activity:   Not Remarkable   Sensorium  Attention:   Normal   Concentration:   Normal   Orientation:   X5   Recall/memory:   Normal   Affect and Mood  Affect:   Depressed   Mood:   Dysphoric   Relating  Eye contact:   Normal   Facial expression:   Responsive   Attitude toward examiner:   Cooperative   Thought and Language  Speech flow:  Clear and Coherent   Thought content:   Appropriate to Mood and  Circumstances   Preoccupation:   None   Hallucinations:   None   Organization:  No data recorded  [employer] of Knowledge:   Good   Intelligence:   Average   Abstraction:   Normal   Judgement:   Good   Reality Testing:   Realistic   Insight:   Fair   Decision Making:   Normal   Social Functioning  Social Maturity:   Responsible   Social Judgement:   Normal   Stress  Stressors:   Work   Coping Ability:   Normal   Skill Deficits:   None   Supports:   Friends/Service system     Religion:    Leisure/Recreation:    Exercise/Diet:     CCA Employment/Education Employment/Work Situation: Employment / Work Situation Employment Situation: Employed Work Stressors: Due to back pain from having a "bad back" patient report it is challenging to be productive due to pain - contributes to substance use Patient's Job has Been Impacted by Current Illness: No Has Patient ever Been in the [employer]?: No  Education: Education Is Patient Currently Attending School?: No Last Grade Completed: 12 Did You Attend College?: No Did You Have An Individualized Education Program (IIEP): No Did You Have Any Difficulty At [employer]?: No Patient's Education Has Been Impacted by Current Illness: No   CCA Family/Childhood History Family and Relationship History: Family history Marital status: Single Does patient have children?: No  Childhood History:  Childhood History By whom was/is the patient raised?: Both parents Did patient suffer any verbal/emotional/physical/sexual abuse as a child?: No Did patient suffer from severe childhood neglect?: No Has patient ever been sexually abused/assaulted/raped as an adolescent or adult?: No Was the patient ever a victim of a crime or a disaster?: No Witnessed domestic violence?: No Has patient been affected by domestic violence as an adult?: No  Child/Adolescent Assessment:     CCA Substance  Use Alcohol/Drug Use: Alcohol / Drug Use Pain Medications: OxyCotin - not prescribed Prescriptions: None Over the Counter: None History of alcohol / drug use?: Yes Longest period of sobriety (when/how long): 1.5 years Negative Consequences of Use: Personal relationships Withdrawal Symptoms: Patient aware of relationship between substance abuse and physical/medical complications Substance #1 Name of Substance 1: OxyCotin 1 - Age of First Use: Unknown 1 - Amount (size/oz): 30mg  tablets 1 - Frequency: 3x daily 1 - Duration: 8 years 1 - Last Use / Amount: 11/07/21 1 - Method of Aquiring: Purchasing off streets 1- Route of Use: Smoking/Snorting Substance #2 Name of Substance 2: Cannabis 2 -  Age of First Use: Unknown 2 - Amount (size/oz): Unknown 2 - Frequency: Daily 2 - Duration: 8 years 2 - Last Use / Amount: 11/06/21 2 - Method of Aquiring: Purchasing off streets 2 - Route of Substance Use: Smoking                     ASAM's:  Six Dimensions of Multidimensional Assessment  Dimension 1:  Acute Intoxication and/or Withdrawal Potential:      Dimension 2:  Biomedical Conditions and Complications:      Dimension 3:  Emotional, Behavioral, or Cognitive Conditions and Complications:     Dimension 4:  Readiness to Change:     Dimension 5:  Relapse, Continued use, or Continued Problem Potential:     Dimension 6:  Recovery/Living Environment:     ASAM Severity Score:    ASAM Recommended Level of Treatment: ASAM Recommended Level of Treatment: Level III Residential Treatment   Substance use Disorder (SUD) Substance Use Disorder (SUD)  Checklist Symptoms of Substance Use: Continued use despite having a persistent/recurrent physical/psychological problem caused/exacerbated by use, Substance(s) often taken in larger amounts or over longer times than was intended  Recommendations for Services/Supports/Treatments: Recommendations for Services/Supports/Treatments Recommendations  For Services/Supports/Treatments: Facility Based Crisis  Discharge Disposition: Discharge Disposition Medical Exam completed: Yes Disposition of Patient: Admit  DSM5 Diagnoses: Patient Active Problem List   Diagnosis Date Noted   Opioid use disorder 11/07/2021     Referrals to Alternative Service(s): Referred to Alternative Service(s):   Place:   Date:   Time:    Referred to Alternative Service(s):   Place:   Date:   Time:    Referred to Alternative Service(s):   Place:   Date:   Time:    Referred to Alternative Service(s):   Place:   Date:   Time:     Maeola Sarah, LCSW

## 2021-11-08 NOTE — ED Notes (Signed)
Patient is calm and cooperative with care.  Patient presently asleep in bed without complaint or distress.  He denies avh shi or plan and is able to make needs known to staff.  Will monitor and meet needs as they arise.  No evidence of withdrawal.

## 2021-11-08 NOTE — Progress Notes (Signed)
Patient attended recovery group run by RN.  Participated well and appears motivated for treatment.

## 2021-11-09 DIAGNOSIS — F1121 Opioid dependence, in remission: Secondary | ICD-10-CM | POA: Diagnosis not present

## 2021-11-09 DIAGNOSIS — F1210 Cannabis abuse, uncomplicated: Secondary | ICD-10-CM | POA: Diagnosis not present

## 2021-11-09 DIAGNOSIS — F1410 Cocaine abuse, uncomplicated: Secondary | ICD-10-CM | POA: Diagnosis not present

## 2021-11-09 DIAGNOSIS — Z20822 Contact with and (suspected) exposure to covid-19: Secondary | ICD-10-CM | POA: Diagnosis not present

## 2021-11-09 LAB — PROLACTIN: Prolactin: 11.6 ng/mL (ref 4.0–15.2)

## 2021-11-09 NOTE — ED Notes (Signed)
Pt is sick and couldn't go to AA and group tonight.

## 2021-11-09 NOTE — ED Notes (Signed)
Pt is in the bed sleeping. Respirations are even and unlabored. No acute distress noted. Will continue to monitor for safety. 

## 2021-11-09 NOTE — ED Notes (Signed)
Pt asleep at this hour. No apparent distress. RR even and unlabored. Monitored for safety.  

## 2021-11-09 NOTE — ED Notes (Signed)
Patient awake at present talking with M.D.  He is calm and pleasant although dysphoric with some anxiety and demoralization.  Patient accepts support and reassurance.  No evidence of withdrawal at this time.  Will continue to encourage him to make needs known and will monitor.

## 2021-11-09 NOTE — Progress Notes (Signed)
Patient calm and quiet.  He is pleasant, organized and logical.  Mood is sad affect constricted.  He is presently resting in bed.  Patient encouraged to seek staff to have needs met.  Patient cows is 0.  Will monitor.

## 2021-11-09 NOTE — ED Provider Notes (Signed)
FBC Progress Note  Date and Time: 11/09/2021 1:52 PM Name: BRAILON DON MRN:  [id number]  Reason For Admission: Opiate Addiction  Subjective:   Christopher Hughes is a 56 y.o. male who presented to [facility] on 8/10 for Opioid Abuse, he was admitted to [facility] on 11/07/2021. PPHx is significant for Substance Abuse (Opioids, Cocaine, THC) and Substance Induced Psychosis.  He reports that he is doing okay today.  He reports his sleep was so-so last night.  He reports that he did have cramps which disrupted his sleep.  He reports his appetite is improving.  He reports no SI, HI, or AVH.  He reports the struggles he has had with substance abuse recently.  He reports that in the past he was able to use every couple weeks but was able to walk away from it.  He reports that now with fentanyl and everything and got harder and harder to walk away and got to the point where he was using daily.  Discussed again about the possibility of starting naltrexone.  Discussed that it can precipitate instantaneous withdrawal and that given he was on fentanyl it is best practices to wait a few days before starting it.  He reported understanding and that he is still interested in starting naltrexone when able to.  He reports his plan is still residential treatment.  Discussed social work will continue to assist him with this.  He does report last night having interrupted sleep.  He also reports continuing to have issues with constipation.  He reports he was able to have a small bowel yesterday.  Encouraged him to continue taking his MiraLAX and Senokot to assist with this.  He reports having sinus pain but reports this is due to his history of snorting cocaine.  He reports no other concerns at present.    Diagnosis:  Final diagnoses:  Opioid dependence in remission (HCC)  Cocaine abuse (HCC)  Cannabis abuse    Total Time spent with patient: 20 minutes  Past Psychiatric History:  Substance Abuse (Opioids, Cocaine, THC)  and Substance Induced Psychosis.  Past Medical History:  Past Medical History:  Diagnosis Date   Carpal tunnel syndrome    Herniated cervical disc       Family History: No family history on file.  Family Psychiatric History: Sister- PTSD and Bipolar Disorder.  Social History:  Social History   Socioeconomic History   Marital status: Single    Spouse name: Not on file   Number of children: Not on file   Years of education: Not on file   Highest education level: Not on file  Occupational History   Not on file  Tobacco Use   Smoking status: Every Day    Packs/day: 0.50    Types: Cigarettes   Smokeless tobacco: Never  Vaping Use   Vaping Use: Former  Substance and Sexual Activity   Alcohol use: Yes    Alcohol/week: 35.0 standard drinks of alcohol    Types: 35 Cans of beer per week    Comment: pt drinks 1/5 of liquor a day   Drug use: Yes   Sexual activity: Not on file  Other Topics Concern   Not on file  Social History Narrative   Not on file   Social Determinants of Health   Financial Resource Strain: Not on file  Food Insecurity: Not on file  Transportation Needs: Not on file  Physical Activity: Not on file  Stress: Not on file  Social Connections: Not  on file  Intimate Partner Violence: Not on file    Labs  Lab Results:     Latest Ref Rng & Units 11/07/2021    6:08 PM 07/23/2018    3:51 AM 03/29/2018   10:30 PM  CBC  WBC 4.0 - 10.5 K/uL 8.8  9.7  8.5   Hemoglobin 13.0 - 17.0 g/dL 16.1  09.6  04.5   Hematocrit 39.0 - 52.0 % 45.9  45.4  49.5   Platelets 150 - 400 K/uL 238  268  256       Latest Ref Rng & Units 11/07/2021    6:08 PM 07/23/2018    3:51 AM 03/29/2018   10:30 PM  CMP  Glucose 70 - 99 mg/dL 83  409  811   BUN 6 - 20 mg/dL 23  17  19    Creatinine 0.61 - 1.24 mg/dL  9.14  7.82   Sodium 135 - 145 mmol/L 141  140  139   Potassium 3.5 - 5.1 mmol/L 5.0  4.1  3.9   Chloride 98 - 111 mmol/L 104  106  106   CO2 22 - 32 mmol/L 30  22   24    Calcium 8.9 - 10.3 mg/dL 9.6  9.3  9.1   Total Protein 6.5 - 8.1 g/dL 7.0     Total Bilirubin 0.3 - 1.2 mg/dL 0.3     Alkaline Phos 38 - 126 U/L 59     AST 15 - 41 U/L 16     ALT 0 - 44 U/L 19       Physical Findings   PHQ2-9    Flowsheet Row ED from 11/07/2021 in [facility]  PHQ-2 Total Score 2  PHQ-9 Total Score 18      Flowsheet Row ED from 11/07/2021 in [facility]  C-SSRS RISK CATEGORY No Risk        Musculoskeletal  Strength & Muscle Tone: within normal limits Gait & Station: normal Patient leans: N/A  Psychiatric Specialty Exam  Presentation  General Appearance: Appropriate for Environment; Casual; Fairly Groomed   Eye Contact:Good   Speech:Clear and Coherent; Normal Rate   Speech Volume:Normal   Handedness:Right    Mood and Affect  Mood:Euthymic   Affect:Appropriate; Congruent    Thought Process  Thought Processes:Coherent; Goal Directed   Descriptions of Associations:Intact   Orientation:Full (Time, Place and Person)   Thought Content:Logical   Diagnosis of Schizophrenia or Schizoaffective disorder in past: No     Hallucinations:Hallucinations: None   Ideas of Reference:None   Suicidal Thoughts:Suicidal Thoughts: No   Homicidal Thoughts:Homicidal Thoughts: No    Sensorium  Memory:Immediate Good; Recent Good   Judgment:Good   Insight:Good    Executive Functions  Concentration:Good   Attention Span:Good   Recall:Good   Fund of Knowledge:Good   Language:Good    Psychomotor Activity  Psychomotor Activity:Psychomotor Activity: Normal    Assets  Assets:Communication Skills; Desire for Improvement; Financial Resources/Insurance; Housing; Leisure Time; Physical Health; Resilience; Social Support    Sleep  Sleep:Sleep: Fair    Physical Exam  Physical Exam Vitals and nursing note reviewed.  Constitutional:      General: He is  not in acute distress.    Appearance: Normal appearance. He is normal weight. He is not ill-appearing or toxic-appearing.  HENT:     Head: Normocephalic and atraumatic.  Pulmonary:     Effort: Pulmonary effort is normal.  Musculoskeletal:  General: Normal range of motion.  Neurological:     General: No focal deficit present.     Mental Status: He is alert.    Review of Systems  HENT:  Positive for congestion.   Respiratory:  Negative for cough and shortness of breath.   Cardiovascular:  Negative for chest pain.  Gastrointestinal:  Positive for constipation. Negative for abdominal pain, diarrhea, nausea and vomiting.  Neurological:  Negative for dizziness, weakness and headaches.  Psychiatric/Behavioral:  Positive for substance abuse. Negative for depression, hallucinations and suicidal ideas. The patient is not nervous/anxious.    Blood pressure 117/80, pulse 68, temperature 98.3 F (36.8 C), temperature source Oral, resp. rate 18, SpO2 99 %. There is no height or weight on file to calculate BMI.  ASSESSMENT FLOR HOUDESHELL is a 56 y.o. male with PMHx of opiate use disorder, cocaine use disorder, substance-induced psychosis, cannabis use disorder presenting to [facility] for substance detox as well as residential substance rehab.   Bransyn is tolerating withdrawal so far without significant issues.  He continues to have opiate induced constipation but is still having some BM.  He is still willing to start Naltrexone but will continue to not start due to precipitating immediate withdrawal.  Will still plan on [facility].  We will continue to monitor.   PLAN Polysubstance abuse Opiate use disorder Stimulant use disorder-cocaine Cannabis use disorder -Continue COWS -Continue Clonidine 0.1 mg BID PRN COWS> 8 -Continue Imodium 2-4 mg PRN diarrhea -Continue Robaxin 500 mg q8 PRN muscle spasms -Continue Naproxen 500 mg BID PRN pain -Continue Zofran-ODT 4 mg q6 PRN  nausea -Continue Bentyl 20 mg q6 PRN spasms -Continue Multivitamin daily for nutritional supplementation -Consider starting naltrexone for opiate dependence (LFTs normal) -Monitor for further withdrawal symptoms with COWS -Encourage patient to cease all substance use    Opiate-induced constipation -Continue MiraLAX 17 g daily -Continue Senokot 1 tablet daily    Nicotine Dependence: -Continue Nicotine Patch 14 mg daily   -Continue PRN's: Tylenol, Maalox, Atarax, Milk of Magnesia, Trazodone     Dispo: LCSW to assist with [facility] application   Lauro Franklin, MD 11/09/2021 1:52 PM

## 2021-11-09 NOTE — Progress Notes (Signed)
Patient is now reporting opiate withdrawal symptoms of muscle aches, stomach cramping, sweating and chills.  He states he has generalized body ache and is increasingly anxious.  Patient given comfort meds of naproxyn, robaxin, bentyl, clonidine and vistaril.  Patient given clean dry scrubs and encouraged to take a hot shower which he is doing now.  Patients linens on his bed changes.  Denies nausea , vomiting or diahrea.  Cows is a 9 at this time.  Will monitor and encourage him to seek out staff if symptoms worsen.

## 2021-11-10 DIAGNOSIS — F1121 Opioid dependence, in remission: Secondary | ICD-10-CM | POA: Diagnosis not present

## 2021-11-10 DIAGNOSIS — F1410 Cocaine abuse, uncomplicated: Secondary | ICD-10-CM | POA: Diagnosis not present

## 2021-11-10 DIAGNOSIS — Z20822 Contact with and (suspected) exposure to covid-19: Secondary | ICD-10-CM | POA: Diagnosis not present

## 2021-11-10 DIAGNOSIS — F1210 Cannabis abuse, uncomplicated: Secondary | ICD-10-CM | POA: Diagnosis not present

## 2021-11-10 NOTE — ED Notes (Signed)
Patient sleeping - no sxs of distress noted - will continue to monitor for safety 

## 2021-11-10 NOTE — ED Notes (Signed)
Pt is resting quietly in room.  Reports that he is feeling " much better after prn medications.  Denies Pain,  at this time.  Pt also Denies SI, HI or AVH.  Staff will continue to monitor for safety.

## 2021-11-10 NOTE — ED Notes (Signed)
Pt given prn medication for opiate withdrawal and anxiety

## 2021-11-10 NOTE — ED Notes (Signed)
Pt resting quietly this morning.  He is complaining of opiate withdrawal.  COWS 11. He has been given Clonidine and PRN medications.  Denies SI, HI or AVH.  Staff will continue to monitor for safety.

## 2021-11-10 NOTE — ED Notes (Signed)
Pt is currently on the phone crying while talking to family member. He is in view of nursing station.  Q 15 min checks being performed.
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
## 2021-11-10 NOTE — ED Notes (Signed)
Patient sleeping with no sxs of distress noted at this time - will continue to monitor for safety 

## 2021-11-11 ENCOUNTER — Emergency Department (HOSPITAL_COMMUNITY): Payer: Self-pay

## 2021-11-11 ENCOUNTER — Other Ambulatory Visit: Payer: Self-pay

## 2021-11-11 ENCOUNTER — Emergency Department (HOSPITAL_COMMUNITY)
Admission: EM | Admit: 2021-11-11 | Discharge: 2021-11-12 | Disposition: A | Discharge status: Home / Self Care | Payer: Self-pay | Attending: Emergency Medicine | Admitting: Emergency Medicine

## 2021-11-11 DIAGNOSIS — R55 Syncope and collapse: Secondary | ICD-10-CM | POA: Insufficient documentation

## 2021-11-11 DIAGNOSIS — R7309 Other abnormal glucose: Secondary | ICD-10-CM | POA: Insufficient documentation

## 2021-11-11 DIAGNOSIS — F1910 Other psychoactive substance abuse, uncomplicated: Secondary | ICD-10-CM | POA: Insufficient documentation

## 2021-11-11 DIAGNOSIS — R079 Chest pain, unspecified: Secondary | ICD-10-CM | POA: Insufficient documentation

## 2021-11-11 DIAGNOSIS — F1210 Cannabis abuse, uncomplicated: Secondary | ICD-10-CM | POA: Diagnosis not present

## 2021-11-11 DIAGNOSIS — R0602 Shortness of breath: Secondary | ICD-10-CM | POA: Insufficient documentation

## 2021-11-11 DIAGNOSIS — Z79899 Other long term (current) drug therapy: Secondary | ICD-10-CM | POA: Insufficient documentation

## 2021-11-11 DIAGNOSIS — Z0279 Encounter for issue of other medical certificate: Secondary | ICD-10-CM | POA: Insufficient documentation

## 2021-11-11 DIAGNOSIS — F1121 Opioid dependence, in remission: Secondary | ICD-10-CM | POA: Diagnosis not present

## 2021-11-11 DIAGNOSIS — F1410 Cocaine abuse, uncomplicated: Secondary | ICD-10-CM | POA: Diagnosis not present

## 2021-11-11 DIAGNOSIS — R0981 Nasal congestion: Secondary | ICD-10-CM | POA: Insufficient documentation

## 2021-11-11 DIAGNOSIS — Z20822 Contact with and (suspected) exposure to covid-19: Secondary | ICD-10-CM | POA: Diagnosis not present

## 2021-11-11 LAB — CBC WITH DIFFERENTIAL/PLATELET
Abs Immature Granulocytes: 0.02 10*3/uL (ref 0.00–0.07)
Basophils Absolute: 0 10*3/uL (ref 0.0–0.1)
Basophils Relative: 0 %
Eosinophils Absolute: 0 10*3/uL (ref 0.0–0.5)
Eosinophils Relative: 0 %
HCT: 49.3 % (ref 39.0–52.0)
Hemoglobin: 17.3 g/dL — ABNORMAL HIGH (ref 13.0–17.0)
Immature Granulocytes: 0 %
Lymphocytes Relative: 14 %
Lymphs Abs: 1.5 10*3/uL (ref 0.7–4.0)
MCH: 31.4 pg (ref 26.0–34.0)
MCHC: 35.1 g/dL (ref 30.0–36.0)
MCV: 89.5 fL (ref 80.0–100.0)
Monocytes Absolute: 0.6 10*3/uL (ref 0.1–1.0)
Monocytes Relative: 6 %
Neutro Abs: 8.9 10*3/uL — ABNORMAL HIGH (ref 1.7–7.7)
Neutrophils Relative %: 80 %
Platelets: 257 10*3/uL (ref 150–400)
RBC: 5.51 MIL/uL (ref 4.22–5.81)
RDW: 12.6 % (ref 11.5–15.5)
WBC: 11 10*3/uL — ABNORMAL HIGH (ref 4.0–10.5)
nRBC: 0 % (ref 0.0–0.2)

## 2021-11-11 LAB — COMPREHENSIVE METABOLIC PANEL
ALT: 19 U/L (ref 0–44)
AST: 20 U/L (ref 15–41)
Albumin: 4.4 g/dL (ref 3.5–5.0)
Alkaline Phosphatase: 58 U/L (ref 38–126)
Anion gap: 11 (ref 5–15)
BUN: 30 mg/dL — ABNORMAL HIGH (ref 6–20)
CO2: 18 mmol/L — ABNORMAL LOW (ref 22–32)
Calcium: 9.7 mg/dL (ref 8.9–10.3)
Chloride: 105 mmol/L (ref 98–111)
Creatinine, Ser: 1.21 mg/dL (ref 0.61–1.24)
GFR, Estimated: >60 mL/min (ref >60)
Glucose, Bld: 124 mg/dL — ABNORMAL HIGH (ref 70–99)
Potassium: 4.4 mmol/L (ref 3.5–5.1)
Sodium: 134 mmol/L — ABNORMAL LOW (ref 135–145)
Total Bilirubin: 1 mg/dL (ref 0.3–1.2)
Total Protein: 7.6 g/dL (ref 6.5–8.1)

## 2021-11-11 LAB — ETHANOL: Alcohol, Ethyl (B): <10 mg/dL (ref <10)

## 2021-11-11 LAB — ACETAMINOPHEN LEVEL: Acetaminophen (Tylenol), Serum: <10 ug/mL — ABNORMAL LOW (ref 10–30)

## 2021-11-11 LAB — RAPID URINE DRUG SCREEN, HOSP PERFORMED
Amphetamines: NOT DETECTED
Barbiturates: NOT DETECTED
Benzodiazepines: NOT DETECTED
Cocaine: POSITIVE — AB
Opiates: NOT DETECTED
Tetrahydrocannabinol: POSITIVE — AB

## 2021-11-11 LAB — SALICYLATE LEVEL: Salicylate Lvl: <7 mg/dL — ABNORMAL LOW (ref 7.0–30.0)

## 2021-11-11 LAB — CBG MONITORING, ED: Glucose-Capillary: 134 mg/dL — ABNORMAL HIGH (ref 70–99)

## 2021-11-11 LAB — TROPONIN I (HIGH SENSITIVITY): Troponin I (High Sensitivity): 4 ng/L (ref <18)

## 2021-11-11 MED ORDER — ONDANSETRON 4 MG PO TBDP
4.0000 mg | ORAL_TABLET | Freq: Once | ORAL | Status: AC
Start: 2021-11-11 — End: 2021-11-11
  Administered 2021-11-11: 4 mg via ORAL
  Filled 2021-11-11: qty 1

## 2021-11-11 MED ORDER — HYDROXYZINE HCL 25 MG PO TABS: 25.0000 mg | ORAL_TABLET | Freq: Three times a day (TID) | ORAL | 0 refills | Status: DC | PRN

## 2021-11-11 MED ORDER — GABAPENTIN 100 MG PO CAPS: 100.0000 mg | ORAL_CAPSULE | Freq: Three times a day (TID) | ORAL | 0 refills | Status: DC

## 2021-11-11 MED ORDER — NICOTINE 14 MG/24HR TD PT24: 14.0000 mg | MEDICATED_PATCH | Freq: Every day | TRANSDERMAL | 0 refills | Status: AC

## 2021-11-11 MED ORDER — TRAZODONE HCL 50 MG PO TABS: 50.0000 mg | ORAL_TABLET | Freq: Every evening | ORAL | 0 refills | Status: DC | PRN

## 2021-11-11 MED ORDER — IPRATROPIUM-ALBUTEROL 0.5-2.5 (3) MG/3ML IN SOLN
3.0000 mL | Freq: Once | RESPIRATORY_TRACT | Status: AC
  Administered 2021-11-11: 3 mL via RESPIRATORY_TRACT
  Filled 2021-11-11: qty 3

## 2021-11-11 MED ORDER — GABAPENTIN 100 MG PO CAPS
100.0000 mg | ORAL_CAPSULE | Freq: Three times a day (TID) | ORAL | Status: DC
  Administered 2021-11-11: 100 mg via ORAL
  Filled 2021-11-11: qty 1

## 2021-11-11 NOTE — ED Notes (Signed)
Patient transported to X-ray 

## 2021-11-11 NOTE — ED Notes (Addendum)
At 2053 MHT reported to nurse that pt is complaining of chest pain. Nurse went to see pt, pt was unresponsive. Vital signs was taken. BP 148/99, Pulse 148, O2 83, Res 16. AC was called and Provider was notified.

## 2021-11-11 NOTE — ED Triage Notes (Signed)
Pt arrives from [facility] via [facility] for medical clearance. Per report by EMS, the call out was for "unresponsiveness". The pt admitted to purposely acting this way. He c/o nausea with eating, chest wall pain and body aches. He is at the facility for detox. En route vitals 128/86, hr 80, 98% ra, cbg 167

## 2021-11-11 NOTE — ED Provider Notes (Signed)
NP was notify by [facility] RN that pt was complaining of chest pain. Few moment later,  pt was not responding to verbal commands,  vitals 140/99,  P148,  O2 83.  Per the nurse pt was non-responsive.  EMS was called.  Pt did start to wake up a little.  Spoke with Dr Vevelyn Royals MD at [facility].  Sending patient for medical clearance.  NP spoke with [facility],  Dr Annie Sable who stated the ED was full so we should send him to [facility].

## 2021-11-11 NOTE — ED Triage Notes (Signed)
Pt says that he has been sneezing a lot. The left side of his chest hurts and having some shortness of breath.

## 2021-11-11 NOTE — Progress Notes (Signed)
Pt's COWS was 17.

## 2021-11-11 NOTE — Progress Notes (Signed)
Pt is presently resting in his room. No distress noted or concerns voiced. Staff will monitor for pt's safety.

## 2021-11-11 NOTE — Progress Notes (Signed)
Pt's COWS was 5.

## 2021-11-11 NOTE — ED Provider Triage Note (Signed)
  Emergency Medicine Provider Triage Evaluation Note  MRN:  [id number]  Arrival date & time: 11/11/21    Medically screening exam initiated at 10:07 PM.   CC:   Medical Clearance   HPI:  Christopher Hughes is a 56 y.o. year-old male presents to the ED with chief complaint of SOB and chest pain.  Sent from [facility] for medical clearance.  States he just spent a week in detox.  Reports having new chest pain and SOB.  History provided by patient. ROS:  -As included in HPI PE:   Vitals:   11/11/21 2154  BP: (!) 131/97  Pulse: (!) 109  Resp: 16  Temp: 98.5 F (36.9 C)  SpO2: 95%    Non-toxic appearing No respiratory distress, +rales  MDM:  Based on signs and symptoms, CAP is highest on my differential, followed by ACS. I've ordered labs and imaging in triage to expedite lab/diagnostic workup.  Patient was informed that the remainder of the evaluation will be completed by another provider, this initial triage assessment does not replace that evaluation, and the importance of remaining in the ED until their evaluation is complete.    Roxy Horseman, PA-C 11/11/21 2208

## 2021-11-11 NOTE — Progress Notes (Signed)
Pt is presently resting quietly. Pt is oriented X4. Pt was experiencing mild withdrawal and was medicated per order. No signs of acute distress noted. Pt denies current SI/HI/AVH. Staff will monitor for pt's safety.

## 2021-11-11 NOTE — ED Notes (Signed)
Pt is in the bed sleeping. Respirations are even and unlabored. No acute distress noted. Will continue to monitor for safety. 

## 2021-11-11 NOTE — ED Notes (Signed)
Pt with episodes of nausea and vomiting.

## 2021-11-11 NOTE — ED Notes (Signed)
Pt refused Lunch

## 2021-11-11 NOTE — ED Notes (Signed)
Patient sleeping with no sxs of distress noted - will continue to monitor for safety 

## 2021-11-11 NOTE — Discharge Instructions (Signed)

## 2021-11-11 NOTE — ED Provider Notes (Cosign Needed Addendum)
FBC/OBS ASAP Discharge Summary  Date and Time: 11/11/2021 4:31 PM  Name: Christopher Hughes  MRN:  [id number]   Discharge Diagnoses:  Final diagnoses:  Opioid dependence in remission [facility])  Cocaine abuse (HCC)  Cannabis abuse    Subjective: Patient seen and assessed at bedside.  Denies any further opiate withdrawal symptoms besides mild congestion.  Denies SI/HI/AVH.  Eager to go to [facility].  No further questions at this time.  Reports symptoms are well controlled at this time.  Stay Summary by Problem List Christopher Hughes is a 56 y.o. male who presented to [facility] on 8/10 for Opioid Abuse, he was admitted to [facility] on 11/07/2021. PPHx is significant for Substance Abuse (Opioids, Cocaine, THC) and Substance Induced Psychosis. Hospital course is detailed below:  Opiate Use Disorder Patient admitted due to opiate withdrawal and seeking residential substance rehab treatment.  Patient had extensive withdrawal symptoms that were symptomatically managed.  At time of discharge, patient's opiate withdrawal symptoms were well controlled.  Requesting [facility] residential treatment for substance rehab treatment.  Cocaine Abuse Cannabis Abuse Recently relapsed on cocaine.  Encourage patient to cease all substance use including cocaine and cannabis.   Total Time spent with patient: 45 minutes  Past Psychiatric History: Opiate use disorder, substance-induced psychosis, cocaine abuse, cannabis abuse Past Medical History:  Past Medical History:  Diagnosis Date   Carpal tunnel syndrome    Herniated cervical disc    No past surgical history on file. Family History: No family history on file. Family Psychiatric History: unknown Social History:  Social History   Substance and Sexual Activity  Alcohol Use Yes   Alcohol/week: 35.0 standard drinks of alcohol   Types: 35 Cans of beer per week   Comment: pt drinks 1/5 of liquor a day     Social History   Substance and Sexual Activity  Drug  Use Yes    Social History   Socioeconomic History   Marital status: Single    Spouse name: Not on file   Number of children: Not on file   Years of education: Not on file   Highest education level: Not on file  Occupational History   Not on file  Tobacco Use   Smoking status: Every Day    Packs/day: 0.50    Types: Cigarettes   Smokeless tobacco: Never  Vaping Use   Vaping Use: Former  Substance and Sexual Activity   Alcohol use: Yes    Alcohol/week: 35.0 standard drinks of alcohol    Types: 35 Cans of beer per week    Comment: pt drinks 1/5 of liquor a day   Drug use: Yes   Sexual activity: Not on file  Other Topics Concern   Not on file  Social History Narrative   Not on file   Social Determinants of Health   Financial Resource Strain: Not on file  Food Insecurity: Not on file  Transportation Needs: Not on file  Physical Activity: Not on file  Stress: Not on file  Social Connections: Not on file   SDOH:  SDOH Screenings   Alcohol Screen: Not on file  Depression (PHQ2-9): High Risk (11/08/2021)   Depression (PHQ2-9)    PHQ-2 Score: 18  Financial Resource Strain: Not on file  Food Insecurity: Not on file  Housing: Not on file  Physical Activity: Not on file  Social Connections: Not on file  Stress: Not on file  Tobacco Use: High Risk (11/08/2021)   Patient History  Smoking Tobacco Use: Every Day    Smokeless Tobacco Use: Never    Passive Exposure: Not on file  Transportation Needs: Not on file    Tobacco Cessation:  A prescription for an FDA-approved tobacco cessation medication provided at discharge  Current Medications:  Current Facility-Administered Medications  Medication Dose Route Frequency Provider Last Rate Last Admin   acetaminophen (TYLENOL) tablet 650 mg  650 mg Oral Q6H PRN Lenard Lance, FNP   650 mg at 11/11/21 1152   alum & mag hydroxide-simeth (MAALOX/MYLANTA) 200-200-20 MG/5ML suspension 30 mL  30 mL Oral Q4H PRN Lenard Lance, FNP        cloNIDine (CATAPRES) tablet 0.1 mg  0.1 mg Oral BID PRN Park Pope, MD   0.1 mg at 11/11/21 1151   dicyclomine (BENTYL) tablet 20 mg  20 mg Oral Q6H PRN Lenard Lance, FNP   20 mg at 11/11/21 1150   fluticasone (FLONASE) 50 MCG/ACT nasal spray 1 spray  1 spray Each Nare Daily Park Pope, MD   1 spray at 11/11/21 1017   gabapentin (NEURONTIN) capsule 100 mg  100 mg Oral TID Park Pope, MD   100 mg at 11/11/21 1535   hydrOXYzine (ATARAX) tablet 25 mg  25 mg Oral TID PRN Lenard Lance, FNP   25 mg at 11/11/21 1151   loperamide (IMODIUM) capsule 2-4 mg  2-4 mg Oral PRN Lenard Lance, FNP       magnesium hydroxide (MILK OF MAGNESIA) suspension 30 mL  30 mL Oral Daily PRN Lenard Lance, FNP       methocarbamol (ROBAXIN) tablet 500 mg  500 mg Oral Q8H PRN Lenard Lance, FNP   500 mg at 11/11/21 1150   naproxen (NAPROSYN) tablet 500 mg  500 mg Oral BID PRN Lenard Lance, FNP   500 mg at 11/11/21 1019   nicotine (NICODERM CQ - dosed in mg/24 hours) patch 14 mg  14 mg Transdermal Daily Lenard Lance, FNP   14 mg at 11/11/21 1024   ondansetron (ZOFRAN-ODT) disintegrating tablet 4 mg  4 mg Oral Q6H PRN Lenard Lance, FNP       traZODone (DESYREL) tablet 50 mg  50 mg Oral QHS PRN Lenard Lance, FNP   50 mg at 11/10/21 2118   Current Outpatient Medications  Medication Sig Dispense Refill   Phenylephrine-Acetaminophen (TYLENOL SINUS CONGESTION/PAIN PO) Take 2 tablets by mouth every 4 (four) hours as needed (For sinus congestion. Do not take more than 10 in 24 hours).     gabapentin (NEURONTIN) 100 MG capsule Take 1 capsule (100 mg total) by mouth 3 (three) times daily. 90 capsule 0   hydrOXYzine (ATARAX) 25 MG tablet Take 1 tablet (25 mg total) by mouth 3 (three) times daily as needed for anxiety. 30 tablet 0   [START ON 11/12/2021] nicotine (NICODERM CQ - DOSED IN MG/24 HOURS) 14 mg/24hr patch Place 1 patch (14 mg total) onto the skin daily. 28 patch 0   traZODone (DESYREL) 50 MG tablet Take 1 tablet (50 mg  total) by mouth at bedtime as needed for sleep. 30 tablet 0    PTA Medications: (Not in a hospital admission)       11/08/2021    1:32 PM 11/07/2021    6:19 PM  Depression screen PHQ 2/9  Decreased Interest 1 0  Down, Depressed, Hopeless 1 0  PHQ - 2 Score 2 0  Altered sleeping 2   Tired, decreased  energy 3   Change in appetite 2   Feeling bad or failure about yourself  3   Trouble concentrating 2   Moving slowly or fidgety/restless 2   Suicidal thoughts 2   PHQ-9 Score 18   Difficult doing work/chores Very difficult     Flowsheet Row ED from 11/07/2021 in [facility]  C-SSRS RISK CATEGORY No Risk       Musculoskeletal  Strength & Muscle Tone: within normal limits Gait & Station: normal Patient leans: N/A  Psychiatric Specialty Exam  Presentation  General Appearance: Disheveled   Eye Contact:Good   Speech:Clear and Coherent; Normal Rate   Speech Volume:Normal   Handedness:Right    Mood and Affect  Mood:Anxious; Depressed   Affect:Congruent; Tearful    Thought Process  Thought Processes:Coherent   Descriptions of Associations:Intact   Orientation:Full (Time, Place and Person)   Thought Content:Logical      Hallucinations:Hallucinations: None   Ideas of Reference:None   Suicidal Thoughts:Suicidal Thoughts: No   Homicidal Thoughts:Homicidal Thoughts: No    Sensorium  Memory:Immediate Good; Recent Good   Judgment:Good   Insight:Good    Executive Functions  Concentration:Good   Attention Span:Good   Recall:Good   Fund of Knowledge:Good   Language:Good    Psychomotor Activity  Psychomotor Activity:Psychomotor Activity: Normal    Assets  Assets:Communication Skills; Desire for Improvement; Financial Resources/Insurance; Housing; Leisure Time; Physical Health; Resilience; Social Support    Sleep  Sleep:Sleep: Fair    No data recorded   Physical Exam  Physical  Exam Vitals and nursing note reviewed.  Constitutional:      General: He is not in acute distress.    Appearance: He is well-developed.  HENT:     Head: Normocephalic and atraumatic.  Eyes:     Conjunctiva/sclera: Conjunctivae normal.  Cardiovascular:     Rate and Rhythm: Normal rate and regular rhythm.     Heart sounds: No murmur heard. Pulmonary:     Effort: Pulmonary effort is normal. No respiratory distress.     Breath sounds: Normal breath sounds.  Abdominal:     Palpations: Abdomen is soft.     Tenderness: There is no abdominal tenderness.  Musculoskeletal:        General: No swelling.     Cervical back: Neck supple.  Skin:    General: Skin is warm and dry.     Capillary Refill: Capillary refill takes less than 2 seconds.  Neurological:     Mental Status: He is alert.  Psychiatric:        Mood and Affect: Mood normal.    Review of Systems  Respiratory:  Negative for shortness of breath.   Cardiovascular:  Negative for chest pain.  Gastrointestinal:  Negative for abdominal pain, constipation, diarrhea, heartburn, nausea and vomiting.  Neurological:  Negative for headaches.   Blood pressure (!) 143/105, pulse (!) 105, temperature 98.9 F (37.2 C), temperature source Oral, resp. rate 19, SpO2 97 %. There is no height or weight on file to calculate BMI.  Demographic Factors:  Male, Caucasian, and Unemployed  Loss Factors: NA  Historical Factors: Impulsivity  Risk Reduction Factors:   Positive coping skills or problem solving skills  Continued Clinical Symptoms:  Alcohol/Substance Abuse/Dependencies Previous Psychiatric Diagnoses and Treatments Medical Diagnoses and Treatments/Surgeries  Cognitive Features That Contribute To Risk:  None    Suicide Risk:  Mild:  Suicidal ideation of limited frequency, intensity, duration, and specificity.  There are no identifiable plans, no associated  intent, mild dysphoria and related symptoms, good self-control (both  objective and subjective assessment), few other risk factors, and identifiable protective factors, including available and accessible social support.  Plan Of Care/Follow-up recommendations:   Follow-up recommendations:   Activity:  as tolerated Diet:  heart healthy   Comments:  Prescriptions were given at discharge.  Patient is agreeable with the discharge plan.  Patient was given an opportunity to ask questions.  Patient appears to feel comfortable with discharge and denies any current suicidal or homicidal thoughts.    Patient is instructed prior to discharge to: Take all medications as prescribed by mental healthcare provider. Report any adverse effects and or reactions from the medicines to outpatient provider promptly. In the event of worsening symptoms, patient is instructed to call the crisis hotline, 911 and or go to the nearest ED for appropriate evaluation and treatment of symptoms. Patient is to follow-up with primary care provider for other medical issues, concerns and or health care needs.   Park Pope, MD 11/11/2021, 4:31 PM

## 2021-11-12 LAB — TROPONIN I (HIGH SENSITIVITY): Troponin I (High Sensitivity): 5 ng/L (ref <18)

## 2021-11-12 NOTE — ED Provider Notes (Signed)
[facility] EMERGENCY DEPARTMENT Provider Note   CSN: [id number] Arrival date & time: 11/11/21  2156     History  Chief Complaint  Patient presents with   Medical Clearance    Christopher Hughes is a 56 y.o. male.  Patient presents to the emergency department after syncopal episode.  Patient has been in detox for cocaine and opiates.  He reports that he has been having significant nasal congestion causing him to have difficulty breathing.  He was given a nasal spray to use for this but it did not seem to help.  Then tonight he started getting increased shortness of breath, was treated with a nebulizer which did not help.  Patient developed chest pain and then had a brief syncopal episode.       Home Medications Prior to Admission medications   Medication Sig Start Date End Date Taking? Authorizing Provider  gabapentin (NEURONTIN) 100 MG capsule Take 1 capsule (100 mg total) by mouth 3 (three) times daily. 11/11/21 12/11/21  Park Pope, MD  hydrOXYzine (ATARAX) 25 MG tablet Take 1 tablet (25 mg total) by mouth 3 (three) times daily as needed for anxiety. 11/11/21   Park Pope, MD  nicotine (NICODERM CQ - DOSED IN MG/24 HOURS) 14 mg/24hr patch Place 1 patch (14 mg total) onto the skin daily. 11/12/21   Park Pope, MD  Phenylephrine-Acetaminophen (TYLENOL SINUS CONGESTION/PAIN PO) Take 2 tablets by mouth every 4 (four) hours as needed (For sinus congestion. Do not take more than 10 in 24 hours).    [provider]  traZODone (DESYREL) 50 MG tablet Take 1 tablet (50 mg total) by mouth at bedtime as needed for sleep. 11/11/21   Park Pope, MD      Allergies    Codeine    Review of Systems   Review of Systems  Physical Exam Updated Vital Signs BP (!) 148/83   Pulse 77   Temp 98.5 F (36.9 C) (Oral)   Resp 16   SpO2 96%  Physical Exam Vitals and nursing note reviewed.  Constitutional:      General: He is not in acute distress.    Appearance: He is  well-developed.  HENT:     Head: Normocephalic and atraumatic.     Mouth/Throat:     Mouth: Mucous membranes are moist.  Eyes:     General: Vision grossly intact. Gaze aligned appropriately.     Extraocular Movements: Extraocular movements intact.     Conjunctiva/sclera: Conjunctivae normal.  Cardiovascular:     Rate and Rhythm: Normal rate and regular rhythm.     Pulses: Normal pulses.     Heart sounds: Normal heart sounds, S1 normal and S2 normal. No murmur heard.    No friction rub. No gallop.  Pulmonary:     Effort: Pulmonary effort is normal. No respiratory distress.     Breath sounds: Normal breath sounds.  Abdominal:     Palpations: Abdomen is soft.     Tenderness: There is no abdominal tenderness. There is no guarding or rebound.     Hernia: No hernia is present.  Musculoskeletal:        General: No swelling.     Cervical back: Full passive range of motion without pain, normal range of motion and neck supple. No pain with movement, spinous process tenderness or muscular tenderness. Normal range of motion.     Right lower leg: No edema.     Left lower leg: No edema.  Skin:  General: Skin is warm and dry.     Capillary Refill: Capillary refill takes less than 2 seconds.     Findings: No ecchymosis, erythema, lesion or wound.  Neurological:     Mental Status: He is alert and oriented to person, place, and time.     GCS: GCS eye subscore is 4. GCS verbal subscore is 5. GCS motor subscore is 6.     Cranial Nerves: Cranial nerves 2-12 are intact.     Sensory: Sensation is intact.     Motor: Motor function is intact. No weakness or abnormal muscle tone.     Coordination: Coordination is intact.  Psychiatric:        Mood and Affect: Mood normal.        Speech: Speech normal.        Behavior: Behavior normal.     ED Results / Procedures / Treatments   Labs (all labs ordered are listed, but only abnormal results are displayed) Labs Reviewed  COMPREHENSIVE METABOLIC  PANEL - Abnormal; Notable for the following components:      Result Value   Sodium 134 (*)    CO2 18 (*)    Glucose, Bld 124 (*)    BUN 30 (*)    All other components within normal limits  SALICYLATE LEVEL - Abnormal; Notable for the following components:   Salicylate Lvl [id number] (*)    All other components within normal limits  ACETAMINOPHEN LEVEL - Abnormal; Notable for the following components:   Acetaminophen (Tylenol), Serum <10 (*)    All other components within normal limits  RAPID URINE DRUG SCREEN, HOSP PERFORMED - Abnormal; Notable for the following components:   Cocaine POSITIVE (*)    Tetrahydrocannabinol POSITIVE (*)    All other components within normal limits  CBC WITH DIFFERENTIAL/PLATELET - Abnormal; Notable for the following components:   WBC 11.0 (*)    Hemoglobin 17.3 (*)    Neutro Abs 8.9 (*)    All other components within normal limits  CBG MONITORING, ED - Abnormal; Notable for the following components:   Glucose-Capillary 134 (*)    All other components within normal limits  ETHANOL  TROPONIN I (HIGH SENSITIVITY)  TROPONIN I (HIGH SENSITIVITY)    EKG EKG Interpretation  Date/Time:  Monday November 11 2021 22:00:52 EDT Ventricular Rate:  98 PR Interval:  132 QRS Duration: 94 QT Interval:  354 QTC Calculation: 451 R Axis:   -34 Text Interpretation: Normal sinus rhythm Left axis deviation Abnormal ECG When compared with ECG of 07-Nov-2021 18:23, no change Confirmed by Orpah Greek [phone]) on 11/12/2021 1:00:53 AM  Radiology DG Chest 2 View  Result Date: 11/11/2021 CLINICAL DATA:  Shortness of breath EXAM: CHEST - 2 VIEW COMPARISON:  07/23/18 FINDINGS: The heart size and mediastinal contours are within normal limits. Both lungs are clear. The visualized skeletal structures are unremarkable. IMPRESSION: No active cardiopulmonary disease. Electronically Signed   By: Inez Catalina M.D.   On: 11/11/2021 22:31    Procedures Procedures    Medications  Ordered in ED Medications  ipratropium-albuterol (DUONEB) 0.5-2.5 (3) MG/3ML nebulizer solution 3 mL (3 mLs Nebulization Given 11/11/21 2233)  ondansetron (ZOFRAN-ODT) disintegrating tablet 4 mg (4 mg Oral Given 11/11/21 2331)    ED Course/ Medical Decision Making/ A&P                           Medical Decision Making  Patient was sent to the  ER for evaluation of chest pain.  Patient was reported to have a syncopal episode before coming to the ED, however, he told EMS that he make that to "get out of there".  Patient detoxing from opiates and cocaine.  He has been at behavioral health for 4 days.  Patient's cardiac work-up has been unremarkable.  At arrival to the ED he nor had chest pain and has not had any recurrence.  He tells me that he was told he cannot come back to behavioral health and he does not wish to pursue any other placement at this time.  He is not homicidal or suicidal, was there voluntarily.  He is asking be discharged.  As he does not have any medical reason for admission, will discharge the patient with outpatient resources for substance abuse treatment.        Final Clinical Impression(s) / ED Diagnoses Final diagnoses:  Polysubstance abuse (HCC)  Chest pain, unspecified type    Rx / DC Orders ED Discharge Orders     None         Tayo Maute, Canary Brim, MD 11/12/21 [phone]

## 2021-11-12 NOTE — ED Notes (Signed)
Patient verbalizes understanding of d/c instructions. Opportunities for questions and answers were provided. Belongings returned to patient. Pt d/c from ED and ambulated to lobby.

## 2021-11-14 ENCOUNTER — Emergency Department (HOSPITAL_COMMUNITY): Payer: Self-pay

## 2021-11-14 ENCOUNTER — Emergency Department (HOSPITAL_COMMUNITY)
Admission: EM | Admit: 2021-11-14 | Discharge: 2021-11-15 | Disposition: A | Discharge status: Home / Self Care | Payer: Self-pay | Attending: Emergency Medicine | Admitting: Emergency Medicine

## 2021-11-14 ENCOUNTER — Other Ambulatory Visit: Payer: Self-pay

## 2021-11-14 ENCOUNTER — Encounter (HOSPITAL_COMMUNITY): Payer: Self-pay | Admitting: Emergency Medicine

## 2021-11-14 DIAGNOSIS — F1490 Cocaine use, unspecified, uncomplicated: Secondary | ICD-10-CM | POA: Insufficient documentation

## 2021-11-14 DIAGNOSIS — F332 Major depressive disorder, recurrent severe without psychotic features: Secondary | ICD-10-CM

## 2021-11-14 DIAGNOSIS — R440 Auditory hallucinations: Secondary | ICD-10-CM | POA: Insufficient documentation

## 2021-11-14 DIAGNOSIS — R45851 Suicidal ideations: Secondary | ICD-10-CM

## 2021-11-14 DIAGNOSIS — F1994 Other psychoactive substance use, unspecified with psychoactive substance-induced mood disorder: Secondary | ICD-10-CM

## 2021-11-14 DIAGNOSIS — R441 Visual hallucinations: Secondary | ICD-10-CM | POA: Insufficient documentation

## 2021-11-14 DIAGNOSIS — F1190 Opioid use, unspecified, uncomplicated: Secondary | ICD-10-CM | POA: Diagnosis present

## 2021-11-14 DIAGNOSIS — F1410 Cocaine abuse, uncomplicated: Secondary | ICD-10-CM

## 2021-11-14 DIAGNOSIS — Z20822 Contact with and (suspected) exposure to covid-19: Secondary | ICD-10-CM | POA: Insufficient documentation

## 2021-11-14 DIAGNOSIS — F1990 Other psychoactive substance use, unspecified, uncomplicated: Secondary | ICD-10-CM

## 2021-11-14 LAB — COMPREHENSIVE METABOLIC PANEL
ALT: 46 U/L — ABNORMAL HIGH (ref 0–44)
AST: 26 U/L (ref 15–41)
Albumin: 4.3 g/dL (ref 3.5–5.0)
Alkaline Phosphatase: 52 U/L (ref 38–126)
Anion gap: 10 (ref 5–15)
BUN: 22 mg/dL — ABNORMAL HIGH (ref 6–20)
CO2: 22 mmol/L (ref 22–32)
Calcium: 9.5 mg/dL (ref 8.9–10.3)
Chloride: 100 mmol/L (ref 98–111)
Creatinine, Ser: 0.93 mg/dL (ref 0.61–1.24)
GFR, Estimated: >60 mL/min (ref >60)
Glucose, Bld: 109 mg/dL — ABNORMAL HIGH (ref 70–99)
Potassium: 3.6 mmol/L (ref 3.5–5.1)
Sodium: 132 mmol/L — ABNORMAL LOW (ref 135–145)
Total Bilirubin: 1.2 mg/dL (ref 0.3–1.2)
Total Protein: 7.6 g/dL (ref 6.5–8.1)

## 2021-11-14 LAB — CBC
HCT: 45.9 % (ref 39.0–52.0)
Hemoglobin: 15.9 g/dL (ref 13.0–17.0)
MCH: 31.3 pg (ref 26.0–34.0)
MCHC: 34.6 g/dL (ref 30.0–36.0)
MCV: 90.4 fL (ref 80.0–100.0)
Platelets: 206 10*3/uL (ref 150–400)
RBC: 5.08 MIL/uL (ref 4.22–5.81)
RDW: 12.4 % (ref 11.5–15.5)
WBC: 12.1 10*3/uL — ABNORMAL HIGH (ref 4.0–10.5)
nRBC: 0 % (ref 0.0–0.2)

## 2021-11-14 LAB — RAPID URINE DRUG SCREEN, HOSP PERFORMED
Amphetamines: NOT DETECTED
Barbiturates: NOT DETECTED
Benzodiazepines: NOT DETECTED
Cocaine: NOT DETECTED
Opiates: NOT DETECTED
Tetrahydrocannabinol: POSITIVE — AB

## 2021-11-14 LAB — ETHANOL: Alcohol, Ethyl (B): <10 mg/dL (ref <10)

## 2021-11-14 LAB — SARS CORONAVIRUS 2 BY RT PCR: SARS Coronavirus 2 by RT PCR: NEGATIVE

## 2021-11-14 MED ORDER — HYDROXYZINE HCL 10 MG PO TABS
10.0000 mg | ORAL_TABLET | Freq: Once | ORAL | Status: AC
  Administered 2021-11-14: 10 mg via ORAL
  Filled 2021-11-14: qty 1

## 2021-11-14 MED ORDER — OLANZAPINE 5 MG PO TABS
5.0000 mg | ORAL_TABLET | Freq: Every day | ORAL | Status: DC
Start: 2021-11-14 — End: 2021-11-15
  Administered 2021-11-14: 5 mg via ORAL
  Filled 2021-11-14: qty 1

## 2021-11-14 MED ORDER — FLUOXETINE HCL 10 MG PO CAPS
10.0000 mg | ORAL_CAPSULE | Freq: Every day | ORAL | Status: DC
  Administered 2021-11-14: 10 mg via ORAL
  Filled 2021-11-14: qty 1

## 2021-11-14 MED ORDER — NICOTINE 7 MG/24HR TD PT24
7.0000 mg | MEDICATED_PATCH | Freq: Every day | TRANSDERMAL | Status: DC
  Administered 2021-11-14: 7 mg via TRANSDERMAL
  Filled 2021-11-14: qty 1

## 2021-11-14 NOTE — ED Notes (Signed)
Pt changed out to purple scrubs. Pt items have been placed behind triage area.

## 2021-11-14 NOTE — ED Notes (Signed)
Patient give ham sandwich, ginger ale and crackers.

## 2021-11-14 NOTE — ED Notes (Signed)
Pt belonging bag in [facility]

## 2021-11-14 NOTE — ED Notes (Signed)
This RN ambulated with patient to the restroom.

## 2021-11-14 NOTE — ED Triage Notes (Signed)
Pt reports he is suicidal w/ a plan to hang himself. Pt reports he is an ex drug user and is has some withdrawals. Pt does have hallucinations.

## 2021-11-14 NOTE — Consult Note (Signed)
[facility] ED ASSESSMENT   Reason for Consult:  Suicidal Ideations Referring Physician:  Lynden Oxford, MD Patient Identification: Christopher Hughes MRN:  [id number] ED Chief Complaint: MDD (major depressive disorder), recurrent episode, severe (HCC)  Diagnosis:  Principal Problem:   MDD (major depressive disorder), recurrent episode, severe (HCC) Active Problems:   Opioid use disorder   Cocaine use disorder (HCC)   Suicidal ideations   ED Assessment Time Calculation: Start Time: 1900 Stop Time: 1920 Total Time in Minutes (Assessment Completion): 20   Subjective:   Christopher Hughes is a 56 y.o. male patient admitted with a history of depression, opioid use disorder, marijuana use, cocaine use disorder, and substance induced psychosis who [facility] to suicidal ideations with thoughts of hanging himself or cutting his wrists. Of note, patient was admitted to [facility] 11/07/21-11/11/21.   HPI:   Patient reports that after being discharged from [facility] he went to his girlfriend's. He states that the past 3 days he has been having AH of the devil telling him to "end it all" and VH of the devil staring at him with his red eyes. He reports SI with thoughts of cutting his wrists or hanging himself. He reports a history of one suicide attempt when he was in his 77's. He denies homicidal ideations. He reports a history of marijuana use, fentanyl use, heroin use, cocaine use, and morphine use. States that he has not had any illicit substances in 8 days. Denies a history of IVDU. UDS+THC. Bal <10.   Patient reports that he has only been sleeping in 15 minute interval the past 3 days. He reports a decreased appetite and has not been eating or drinking fluids.   On evaluation patient is alert and oriented x 4, pleasant, and cooperative. Speech is clear and coherent. Mood is depressed and affect is congruent with mood. Thought process is coherent and thought content is logical. Endorses AVH as  discussed above. No indication that patient is responding to internal stimuli. No evidence of delusional thought content. He reports SI with thoughts of cutting his wrists or hanging himself. Denies homicidal ideations.    Past Psychiatric History: as above  Risk to Self or Others: Is the patient at risk to self? Yes Has the patient been a risk to self in the past 6 months? No Has the patient been a risk to self within the distant past? Yes Is the patient a risk to others? No Has the patient been a risk to others in the past 6 months? No Has the patient been a risk to others within the distant past? No  [place] Scale:  Flowsheet Row ED from 11/14/2021 in [facility] from 11/07/2021 in [facility]  C-SSRS RISK CATEGORY High Risk No Risk       AIMS:  , , ,  ,   ASAM:    Substance Abuse:     Past Medical History:  Past Medical History:  Diagnosis Date   Carpal tunnel syndrome    Herniated cervical disc    History reviewed. No pertinent surgical history. Family History: History reviewed. No pertinent family history. Family mental health history: includes patient's sister has been diagnosed with PTSD and bipolar disorder.   Social History:  Social History   Substance and Sexual Activity  Alcohol Use Yes   Alcohol/week: 35.0 standard drinks of alcohol   Types: 35 Cans of beer per week   Comment: pt drinks 1/5 of  liquor a day     Social History   Substance and Sexual Activity  Drug Use Yes    Social History   Socioeconomic History   Marital status: Single    Spouse name: Not on file   Number of children: Not on file   Years of education: Not on file   Highest education level: Not on file  Occupational History   Not on file  Tobacco Use   Smoking status: Every Day    Packs/day: 0.50    Types: Cigarettes   Smokeless tobacco: Never  Vaping Use   Vaping Use: Former  Substance and Sexual Activity    Alcohol use: Yes    Alcohol/week: 35.0 standard drinks of alcohol    Types: 35 Cans of beer per week    Comment: pt drinks 1/5 of liquor a day   Drug use: Yes   Sexual activity: Not on file  Other Topics Concern   Not on file  Social History Narrative   Not on file   Social Determinants of Health   Financial Resource Strain: Not on file  Food Insecurity: Not on file  Transportation Needs: Not on file  Physical Activity: Not on file  Stress: Not on file  Social Connections: Not on file   Additional Social History:    Allergies:   Allergies  Allergen Reactions   Codeine Anaphylaxis    Labs:  Results for orders placed or performed during the hospital encounter of 11/14/21 (from the past 48 hour(s))  Comprehensive metabolic panel     Status: Abnormal   Collection Time: 11/14/21  4:30 PM  Result Value Ref Range   Sodium 132 (L) 135 - 145 mmol/L   Potassium 3.6 3.5 - 5.1 mmol/L   Chloride 100 98 - 111 mmol/L   CO2 22 22 - 32 mmol/L   Glucose, Bld 109 (H) 70 - 99 mg/dL    Comment: Glucose reference range applies only to samples taken after fasting for at least 8 hours.   BUN 22 (H) 6 - 20 mg/dL   Creatinine, Ser 1.49 0.61 - 1.24 mg/dL   Calcium 9.5 8.9 - 70.2 mg/dL   Total Protein 7.6 6.5 - 8.1 g/dL   Albumin 4.3 3.5 - 5.0 g/dL   AST 26 15 - 41 U/L   ALT 46 (H) 0 - 44 U/L   Alkaline Phosphatase 52 38 - 126 U/L   Total Bilirubin 1.2 0.3 - 1.2 mg/dL   GFR, Estimated >63 >78 mL/min    Comment: (NOTE) Calculated using the CKD-EPI Creatinine Equation (2021)    Anion gap 10 5 - 15    Comment: Performed at [facility], [address] [place], [place] [address]  Ethanol     Status: None   Collection Time: 11/14/21  4:30 PM  Result Value Ref Range   Alcohol, Ethyl (B) <10 <10 mg/dL    Comment: (NOTE) Lowest detectable limit for serum alcohol is 10 mg/dL.  For medical purposes only. Performed at [facility], [address] [place], [place] [address]   cbc     Status: Abnormal   Collection Time: 11/14/21  4:30 PM  Result Value Ref Range   WBC 12.1 (H) 4.0 - 10.5 K/uL   RBC 5.08 4.22 - 5.81 MIL/uL   Hemoglobin 15.9 13.0 - 17.0 g/dL   HCT 28.7 86.7 - 67.2 %   MCV 90.4 80.0 - 100.0 fL   MCH 31.3 26.0 - 34.0  pg   MCHC 34.6 30.0 - 36.0 g/dL   RDW 37.3 42.8 - 76.8 %   Platelets 206 150 - 400 K/uL   nRBC 0.0 0.0 - 0.2 %    Comment: Performed at [facility], [address], [place], [place] [address]  Rapid urine drug screen (hospital performed)     Status: Abnormal   Collection Time: 11/14/21  4:30 PM  Result Value Ref Range   Opiates NONE DETECTED NONE DETECTED   Cocaine NONE DETECTED NONE DETECTED   Benzodiazepines NONE DETECTED NONE DETECTED   Amphetamines NONE DETECTED NONE DETECTED   Tetrahydrocannabinol POSITIVE (A) NONE DETECTED   Barbiturates NONE DETECTED NONE DETECTED    Comment: (NOTE) DRUG SCREEN FOR MEDICAL PURPOSES ONLY.  IF CONFIRMATION IS NEEDED FOR ANY PURPOSE, NOTIFY LAB WITHIN 5 DAYS.  LOWEST DETECTABLE LIMITS FOR URINE DRUG SCREEN Drug Class                     Cutoff (ng/mL) Amphetamine and metabolites    1000 Barbiturate and metabolites    200 Benzodiazepine                 200 Tricyclics and metabolites     300 Opiates and metabolites        300 Cocaine and metabolites        300 THC                            50 Performed at [facility], [address] [place], [place] [address]     Current Facility-Administered Medications  Medication Dose Route Frequency Provider Last Rate Last Admin   FLUoxetine (PROZAC) capsule 10 mg  10 mg Oral Daily Nira Conn A, NP       hydrOXYzine (ATARAX) tablet 10 mg  10 mg Oral Once Sherian Maroon A, PA       nicotine (NICODERM CQ - dosed in mg/24 hr) patch 7 mg  7 mg Transdermal Daily Sherian Maroon A, PA       OLANZapine (ZYPREXA) tablet 5 mg  5 mg Oral QHS Jackelyn Poling, NP       Current  Outpatient Medications  Medication Sig Dispense Refill   gabapentin (NEURONTIN) 100 MG capsule Take 1 capsule (100 mg total) by mouth 3 (three) times daily. 90 capsule 0   hydrOXYzine (ATARAX) 25 MG tablet Take 1 tablet (25 mg total) by mouth 3 (three) times daily as needed for anxiety. 30 tablet 0   nicotine (NICODERM CQ - DOSED IN MG/24 HOURS) 14 mg/24hr patch Place 1 patch (14 mg total) onto the skin daily. 28 patch 0   Phenylephrine-Acetaminophen (TYLENOL SINUS CONGESTION/PAIN PO) Take 2 tablets by mouth every 4 (four) hours as needed (For sinus congestion. Do not take more than 10 in 24 hours).     traZODone (DESYREL) 50 MG tablet Take 1 tablet (50 mg total) by mouth at bedtime as needed for sleep. 30 tablet 0    Musculoskeletal: Strength & Muscle Tone: within normal limits Gait & Station:  Patient leans: N/A   Psychiatric Specialty Exam: Presentation  General Appearance: Disheveled  Eye Contact:Good  Speech:Clear and Coherent; Normal Rate  Speech Volume:Normal  Handedness:Right   Mood and Affect  Mood:Anxious; Depressed  Affect:Congruent   Thought Process  Thought Processes:Coherent; Linear  Descriptions of Associations:Intact  Orientation:Full (Time, Place and Person)  Thought Content:Logical  History of Schizophrenia/Schizoaffective disorder:No  Duration of Psychotic Symptoms:No data recorded Hallucinations:Hallucinations: Auditory; Visual Description of Auditory Hallucinations: hears the devil telling him "end it all." Description of Visual Hallucinations: the devil's red eyes staring at him  Ideas of Reference:None  Suicidal Thoughts:Suicidal Thoughts: Yes, Active SI Active Intent and/or Plan: With Plan; With Means to Carry Out  Homicidal Thoughts:Homicidal Thoughts: No   Sensorium  Memory:Immediate Good; Recent Good; Remote Good  Judgment:Impaired  Insight:Lacking   Executive Functions  Concentration:Good  Attention  Span:Good  Recall:Good  Fund of Knowledge:Good  Language:Good   Psychomotor Activity  Psychomotor Activity:Psychomotor Activity: Normal   Assets  Assets:Communication Skills; Desire for Improvement; Physical Health    Sleep  Sleep:Sleep: Poor   Physical Exam: Physical Exam Constitutional:      General: He is not in acute distress.    Appearance: He is not ill-appearing, toxic-appearing or diaphoretic.  HENT:     Right Ear: External ear normal.     Left Ear: External ear normal.  Eyes:     General:        Right eye: No discharge.        Left eye: No discharge.  Cardiovascular:     Rate and Rhythm: Normal rate.  Pulmonary:     Effort: Pulmonary effort is normal. No respiratory distress.  Musculoskeletal:        General: Normal range of motion.  Neurological:     Mental Status: He is alert and oriented to person, place, and time.  Psychiatric:        Mood and Affect: Mood is anxious and depressed.        Speech: Speech normal.        Thought Content: Thought content is not paranoid or delusional. Thought content includes suicidal ideation. Thought content does not include homicidal ideation. Thought content includes suicidal plan.    Review of Systems  Constitutional:  Positive for malaise/fatigue. Negative for chills, diaphoresis and fever.  HENT:  Negative for congestion.   Respiratory:  Negative for cough and shortness of breath.   Cardiovascular:  Negative for chest pain and palpitations.  Gastrointestinal:  Negative for diarrhea, nausea and vomiting.  Neurological:  Negative for dizziness and seizures.  Psychiatric/Behavioral:  Positive for depression, hallucinations and suicidal ideas. Negative for memory loss. The patient is nervous/anxious and has insomnia.   All other systems reviewed and are negative.  Blood pressure (!) 147/94, pulse 84, temperature 99.1 F (37.3 C), temperature source Oral, resp. rate 18, SpO2 97 %. There is no height or weight on  file to calculate BMI.  Medical Decision Making: Christopher Hughes is a 56 y.o. male patient admitted with a history of depression, opioid use disorder, marijuana use, cocaine use disorder, and substance induced psychosis who presents to [facility] due to suicidal ideations with thoughts of hanging himself or cutting his wrists. He states that the past 3 days he has been having AH of the devil telling him to "end it all" and VH of the devil staring at him with his red eyes.   Start olanzapine 5 mg QHS for AVH Start fluoxetine 10 mg daily for depression. First dose now.  Problem 1: MDD with psychotic features  Problem 2: Cocaine use disorder  Problem 3: Opioid use disorder  Disposition: Recommend psychiatric Inpatient admission when medically cleared.  Jackelyn Poling, NP 11/14/2021 7:56 PM

## 2021-11-14 NOTE — ED Provider Notes (Signed)
[facility] Provider Note   CSN: [id number] Arrival date & time: 11/14/21  1526     History  Chief Complaint  Patient presents with   Suicidal    Christopher Hughes is a 56 y.o. male.  HPI   56 year old male presents to the emergency department with complaints of suicidal ideation.  Patient has known polysubstance abuse with recently positive for cocaine and THC from 11/11/2021.  He states that he has not had illicit drugs since 8 days ago.  He currently presents with 3 to 4-day history of suicidal ideation, 3 to 4 days of visual and auditory hallucinations.  He states he has not been able to sleep for the past 3 to 4 days and thinks it is secondary to his drug withdrawal.  He was discharged from behavioral health urgent care on 11/08/2021.  He states that since his discharge, he has had difficulty finding care for his psychiatric needs.  He is on no current medicines at this time.  He reports 1 bodily complaint of left-sided rib pain after a mechanical fall last night.  He denies trauma to head or loss of consciousness, blood thinner use or pain elsewhere in his body.  Past medical history significant for carpal tunnel syndrome, herniated disc, opioid use disorder, marijuana use disorder, cocaine use disorder  Home Medications Prior to Admission medications   Medication Sig Start Date End Date Taking? Authorizing Provider  gabapentin (NEURONTIN) 100 MG capsule Take 1 capsule (100 mg total) by mouth 3 (three) times daily. 11/11/21 12/11/21  Park Pope, MD  hydrOXYzine (ATARAX) 25 MG tablet Take 1 tablet (25 mg total) by mouth 3 (three) times daily as needed for anxiety. 11/11/21   Park Pope, MD  nicotine (NICODERM CQ - DOSED IN MG/24 HOURS) 14 mg/24hr patch Place 1 patch (14 mg total) onto the skin daily. 11/12/21   Park Pope, MD  Phenylephrine-Acetaminophen (TYLENOL SINUS CONGESTION/PAIN PO) Take 2 tablets by mouth every 4 (four) hours as needed (For sinus  congestion. Do not take more than 10 in 24 hours).    [provider]  traZODone (DESYREL) 50 MG tablet Take 1 tablet (50 mg total) by mouth at bedtime as needed for sleep. 11/11/21   Park Pope, MD      Allergies    Codeine    Review of Systems   Review of Systems  All other systems reviewed and are negative.   Physical Exam Updated Vital Signs BP (!) 147/94 (BP Location: Left Arm)   Pulse 84   Temp 99.1 F (37.3 C) (Oral)   Resp 18   SpO2 97%  Physical Exam Vitals and nursing note reviewed.  Constitutional:      General: He is not in acute distress.    Appearance: He is well-developed. He is not ill-appearing.  HENT:     Head: Normocephalic and atraumatic.     Right Ear: Tympanic membrane normal.     Left Ear: Tympanic membrane normal.  Eyes:     Extraocular Movements: Extraocular movements intact.     Conjunctiva/sclera: Conjunctivae normal.     Pupils: Pupils are equal, round, and reactive to light.  Cardiovascular:     Rate and Rhythm: Normal rate and regular rhythm.     Heart sounds: No murmur heard. Pulmonary:     Effort: Pulmonary effort is normal. No respiratory distress.     Breath sounds: Normal breath sounds.  Abdominal:     Palpations: Abdomen is soft.  Tenderness: There is no abdominal tenderness. There is no right CVA tenderness or left CVA tenderness.  Musculoskeletal:        General: No swelling.     Cervical back: Neck supple. No rigidity or tenderness.     Right lower leg: No edema.     Left lower leg: No edema.  Skin:    General: Skin is warm and dry.     Capillary Refill: Capillary refill takes less than 2 seconds.  Neurological:     Mental Status: He is alert.     Sensory: Sensation is intact.     Comments: Cranial nerves III through XII grossly intact.  EOMs intact bilaterally.  PERRLA bilaterally.  No strength 5 out of 5 upper lower extremities.  No sensory deficits on major nerve injury of upper extremities.  Radial and  posterior tibial pulses full and intact bilaterally.  Psychiatric:        Mood and Affect: Mood normal.     ED Results / Procedures / Treatments   Labs (all labs ordered are listed, but only abnormal results are displayed) Labs Reviewed  COMPREHENSIVE METABOLIC PANEL - Abnormal; Notable for the following components:      Result Value   Sodium 132 (*)    Glucose, Bld 109 (*)    BUN 22 (*)    ALT 46 (*)    All other components within normal limits  CBC - Abnormal; Notable for the following components:   WBC 12.1 (*)    All other components within normal limits  RAPID URINE DRUG SCREEN, HOSP PERFORMED - Abnormal; Notable for the following components:   Tetrahydrocannabinol POSITIVE (*)    All other components within normal limits  ETHANOL    EKG None  Radiology DG Ribs Unilateral W/Chest Left  Result Date: 11/14/2021 CLINICAL DATA:  Left anterior rib pain EXAM: LEFT RIBS AND CHEST - 3 VIEW COMPARISON:  Chest x-ray dated November 11, 2021 FINDINGS: No fracture or other bone lesions are seen involving the ribs. There is no evidence of pneumothorax or pleural effusion. Both lungs are clear. Heart size and mediastinal contours are within normal limits. IMPRESSION: No evidence of displaced rib fracture. Electronically Signed   By: Allegra Lai M.D.   On: 11/14/2021 17:30    Procedures Procedures    Medications Ordered in ED Medications - No data to display  ED Course/ Medical Decision Making/ A&P                           Medical Decision Making Amount and/or Complexity of Data Reviewed Labs: ordered. Radiology: ordered.   This patient presents to the ED for concern of suicidal ideation, this involves an extensive number of treatment options, and is a complaint that carries with it a high risk of complications and morbidity.  The differential diagnosis includes drug withdrawal, suicidal ideation, homicidal ideation, visual/auditory hallucinations, CVA, intoxication,  substance abuse   Co morbidities that complicate the patient evaluation  See above   Additional history obtained:  Additional history obtained from EMR External records from outside source obtained and reviewed including prior UDS from 11/11/2021 indicating cocaine positive.   Lab Tests:  I Ordered, and personally interpreted labs.  The pertinent results include: Mild leukocytosis with a WBC of 12.1.  Doubt infectious given lack of correlating complaints per patient.  No evidence of anemia.  Mild hyponatremia noted with sodium 132.  Recommend supplementation orally.  BUN 22 which  seems decreased from patient's baseline.  Renal function within normal limits.  Mild elevation of ALT to 46.  UDS positive for THC.  Ethanol within normal limits.   Imaging Studies ordered:  I ordered imaging studies including DG ribs with chest left I independently visualized and interpreted imaging which showed no acute fracture I agree with the radiologist interpretation   Cardiac Monitoring: / EKG:  The patient was maintained on a cardiac monitor.  I personally viewed and interpreted the cardiac monitored which showed an underlying rhythm of: Sinus rhythm   Consultations Obtained:  TTS was consulted regarding the patient pending medical clearance  Problem List / ED Course / Critical interventions / Medication management  Reevaluation of the patient\ showed that the patient improved I have reviewed the patients home medicines and have made adjustments as needed   Social Determinants of Health:  Polysubstance abuse, cigarette use.   Test / Admission - Considered:  Suicidal ideation Vitals signs significant for hypertension with blood pressure 161/99.  Recommend close follow-up with PCP regarding elevation of blood pressure. Otherwise within normal range and stable throughout visit. Laboratory/imaging studies significant for: See above Given lack of significant medical findings on  laboratory studies or exams, patient is deemed medically cleared for TTS consultation.  Involuntary commitment was placed given patient's known suicidal ideation.  Treatment plan was discussed at length with the patient he acknowledged understanding was agreeable to said plan.  Patient was stable upon medical clearance.         Final Clinical Impression(s) / ED Diagnoses Final diagnoses:  Suicidal ideation  Auditory hallucination  Visual hallucination    Rx / DC Orders ED Discharge Orders     None         Peter Garter, [place] 11/14/21 1839    Phoebe Sharps, DO 11/15/21 0018

## 2021-11-14 NOTE — Progress Notes (Signed)
CSW requested that [facility] Fransico Michael, RN review. CSW will assist and follow.   Maryjean Ka, MSW, [facility] 11/14/2021 10:59 PM

## 2021-11-14 NOTE — ED Notes (Signed)
Behavioral Health NP at patient bedside.

## 2021-11-15 ENCOUNTER — Encounter (HOSPITAL_COMMUNITY): Payer: Self-pay | Admitting: Emergency Medicine

## 2021-11-15 MED ORDER — ACETAMINOPHEN 500 MG PO TABS
1000.0000 mg | ORAL_TABLET | Freq: Once | ORAL | Status: AC
  Administered 2021-11-15: 1000 mg via ORAL
  Filled 2021-11-15: qty 2

## 2021-11-15 MED ORDER — MELATONIN 3 MG PO TABS
6.0000 mg | ORAL_TABLET | Freq: Once | ORAL | Status: AC
Start: 2021-11-15 — End: 2021-11-15
  Administered 2021-11-15: 6 mg via ORAL
  Filled 2021-11-15: qty 2

## 2021-11-15 NOTE — ED Notes (Signed)
Patient's belongings placed in [place] [facility] cabinet earlier this afternoon.

## 2021-11-15 NOTE — Progress Notes (Signed)
Inpatient Behavioral health Placement  Pt meets inpatient criteria per Nira Conn, NP.  Referral was sent to the following facilities;  Destination Service Provider Address Phone Fax  [facility]  [address], [place] [address] [phone]  [facility] [address]  [address], [place] [address] [phone]  [facility]  [address] [place], [place] [address] [phone]  [facility]  [address] [place] [address] [phone]  [facility]  [address] [place] [address] [phone]  [facility]  [address] [place], [place] [address] [phone]  [facility]  [address] [place] [address] [phone]  [facility]  [address], [place] [address] [phone]  [facility]  [address] [place] [address] [phone]  [facility]  [address] [place] [address] [phone]  [facility]  [address] [place] [address] [phone]     Situation ongoing,  CSW will follow up.   Maryjean Ka, MSW, LCSWA 11/15/2021  @ 2:14 AM

## 2021-11-15 NOTE — ED Notes (Signed)
Bag of clothes given back to pt

## 2021-11-15 NOTE — Discharge Instructions (Addendum)
It was our pleasure to provide your ER care today - we hope that you feel better. Drink plenty of fluids/stay well hydrated. Take acetaminophen or ibuprofen as need.   Avoid drug use as it is harmful to your physical health and mental well-being.  See resource guide provide in terms of pursuing substance use treatment program.  Also see resource guide provided for behavioral health follow up and/or counseling - follow up closely with behavioral health provider in the next 1-2 weeks.     For mental health issues and/or mental health crisis you may also go directly to the Behavioral Health Urgent Care Center - it is open 24/7 and walk-ins are welcome.   Also follow up primary care doctor in the next 1-2 weeks - have your blood pressure rechecked then, as it is mildly high today.  Return to ER if worse, new symptoms, fevers, chest pain, trouble breathing, persistent vomiting, or other concern.

## 2021-11-15 NOTE — ED Provider Notes (Signed)
Emergency Medicine Observation Re-evaluation Note  Christopher Hughes is a 56 y.o. male, seen on rounds today.  Pt initially presented to the ED for complaints of substance abuse/mood disorder Pt indicates is feeling improved, ate breakfast. No thoughts of harm to self or others. Indicates potentially interesting in pursuing substance use treatment program.  Physical Exam  BP (!) 148/86 (BP Location: Left Arm)   Pulse 79   Temp 99.2 F (37.3 C) (Oral)   Resp 19   SpO2 98%  Physical Exam General: alert, content, no acute distress.  Cardiac: regular rate.  Lungs: breathing comfortably. Psych: normal mood and affect. Pt does not appear acutely depressed or despondent. No SI/HI.  Pt is not responding to internal stimuli - no delusions or hallucinations are noted.   ED Course / MDM    I have reviewed the labs performed to date as well as medications administered while in observation.  Recent changes in the last 24 hours include ED obs, reassessment.   Plan    SHANTANU STRAUCH is not under involuntary commitment.  Patient currently appears stable for d/c - pt is encouraged to pursue rehab program - will also provide resource guide for behavioral health outpatient f/u as well.  Return precautions provided.      Cathren Laine, MD 11/15/21 [phone]

## 2022-03-01 ENCOUNTER — Other Ambulatory Visit: Payer: Self-pay

## 2022-03-01 ENCOUNTER — Emergency Department (HOSPITAL_COMMUNITY)
Admission: EM | Admit: 2022-03-01 | Discharge: 2022-03-01 | Disposition: A | Discharge status: Home / Self Care | Payer: Self-pay | Attending: Student | Admitting: Student

## 2022-03-01 DIAGNOSIS — W4904XA Ring or other jewelry causing external constriction, initial encounter: Secondary | ICD-10-CM | POA: Insufficient documentation

## 2022-03-01 DIAGNOSIS — S60443A External constriction of left middle finger, initial encounter: Secondary | ICD-10-CM | POA: Insufficient documentation

## 2022-03-01 NOTE — ED Provider Triage Note (Signed)
Emergency Medicine Provider Triage Evaluation Note  LESHAWN STRAKA , a 56 y.o. male  was evaluated in triage.  Pt presents today because his ring is stuck on his finger. Denies any injury to the finger, states he just can't get the ring off. States that it has been stuck for 2 days. He went to the fire department first and they tried to cut it off but were unsuccessful.  Review of Systems  Positive:  Negative:   Physical Exam  BP (!) 145/98 (BP Location: Right Arm)   Pulse 81   Temp 98.1 F (36.7 C)   Resp 17   SpO2 95%  Gen:   Awake, no distress   Resp:  Normal effort  MSK:   Moves extremities without difficulty  Other:    Medical Decision Making  Medically screening exam initiated at 4:37 PM.  Appropriate orders placed.  Hubbard Robinson was informed that the remainder of the evaluation will be completed by another provider, this initial triage assessment does not replace that evaluation, and the importance of remaining in the ED until their evaluation is complete.     Vear Clock 03/01/22 [phone]

## 2022-03-01 NOTE — Discharge Instructions (Signed)
Return if development of any new or worsening symptoms 

## 2022-03-01 NOTE — ED Provider Notes (Signed)
[facility] EMERGENCY DEPARTMENT Provider Note   CSN: [id number] Arrival date & time: 03/01/22  1457     History  Chief Complaint  Patient presents with   Finger Injury    Christopher Hughes is a 56 y.o. male.  Patient with non-contributory past medical history presents today with a ring stuck on his left middle finger.  He states that 2 days ago he was working with grease and his wedding band slipped off his ring finger. States he was afraid to put it in his pocket and instead put it on his middle finger.  States when he got home he was unable to get the ring off.  States that he went to the fire department and they used a manual ring cutter but were not successful.  States he has tried several interventions at home without success.  Denies any injuries.  The history is provided by the patient. No language interpreter was used.       Home Medications Prior to Admission medications   Medication Sig Start Date End Date Taking? Authorizing Provider  gabapentin (NEURONTIN) 100 MG capsule Take 1 capsule (100 mg total) by mouth 3 (three) times daily. 11/11/21 12/11/21  Park Pope, MD  hydrOXYzine (ATARAX) 25 MG tablet Take 1 tablet (25 mg total) by mouth 3 (three) times daily as needed for anxiety. 11/11/21   Park Pope, MD  nicotine (NICODERM CQ - DOSED IN MG/24 HOURS) 14 mg/24hr patch Place 1 patch (14 mg total) onto the skin daily. 11/12/21   Park Pope, MD  Phenylephrine-Acetaminophen (TYLENOL SINUS CONGESTION/PAIN PO) Take 2 tablets by mouth every 4 (four) hours as needed (For sinus congestion. Do not take more than 10 in 24 hours).    [provider]  traZODone (DESYREL) 50 MG tablet Take 1 tablet (50 mg total) by mouth at bedtime as needed for sleep. 11/11/21   Park Pope, MD      Allergies    Codeine    Review of Systems   Review of Systems  All other systems reviewed and are negative.   Physical Exam Updated Vital Signs BP (!) 145/98 (BP Location:  Right Arm)   Pulse 81   Temp 98.1 F (36.7 C)   Resp 17   SpO2 95%  Physical Exam Vitals and nursing note reviewed.  Constitutional:      General: He is not in acute distress.    Appearance: Normal appearance. He is normal weight. He is not ill-appearing, toxic-appearing or diaphoretic.  HENT:     Head: Normocephalic and atraumatic.  Cardiovascular:     Rate and Rhythm: Normal rate.  Pulmonary:     Effort: Pulmonary effort is normal. No respiratory distress.  Musculoskeletal:        General: Normal range of motion.     Cervical back: Normal range of motion.  Skin:    General: Skin is warm and dry.     Comments: Large wide wedding band stuck on the left middle finger with significant associated swelling. Capillary refill less than 2 seconds. ROM intact  Neurological:     General: No focal deficit present.     Mental Status: He is alert.  Psychiatric:        Mood and Affect: Mood normal.        Behavior: Behavior normal.     ED Results / Procedures / Treatments   Labs (all labs ordered are listed, but only abnormal results are displayed) Labs Reviewed - No  data to display  EKG None  Radiology No results found.  Procedures Procedures    Medications Ordered in ED Medications - No data to display  ED Course/ Medical Decision Making/ A&P                           Medical Decision Making  Patient presents today with a ring stuck on his ring middle finger.  He is afebrile, nontoxic-appearing, and in no acute distress with reassuring vital signs.  Attempted removal of same with tourniquet technique and manual ring cutter without success. Successfully removed same using the electric ring cutter. No signs of infection or concerns for tenosynovitis. Patient has full ROM without pain and capillary refill less than 2 seconds. He has no other complaints and is stable to be discharged. Patient is understanding and amenable with plan, educated on red flag symptoms that would  prompt immediate return. Patient discharged in stable condition.   This is a shared visit with supervising physician Dr. Posey Rea who has independently evaluated patient & provided guidance in evaluation/management/disposition, in agreement with care    Final Clinical Impression(s) / ED Diagnoses Final diagnoses:  Ring or other jewelry causing external constriction, initial encounter    Rx / DC Orders ED Discharge Orders     None     An After Visit Summary was printed and given to the patient.     Vear Clock 03/01/22 1925    Glendora Score, MD 03/01/22 2034

## 2022-03-01 NOTE — ED Triage Notes (Signed)
Pt states his ring was sliding off his finger so he moved it to his middle finger and that was 2 days ago. Significant swelling to the finger and ring will not come off. Attempted removal by fire dept with ring cutter was unsuccessful.

## 2023-04-24 ENCOUNTER — Ambulatory Visit
Admission: EM | Admit: 2023-04-24 | Discharge: 2023-04-24 | Disposition: A | Discharge status: Home / Self Care | Payer: Self-pay | Attending: Physician Assistant | Admitting: Physician Assistant

## 2023-04-24 ENCOUNTER — Ambulatory Visit (INDEPENDENT_AMBULATORY_CARE_PROVIDER_SITE_OTHER): Payer: Self-pay

## 2023-04-24 DIAGNOSIS — B349 Viral infection, unspecified: Secondary | ICD-10-CM

## 2023-04-24 DIAGNOSIS — R051 Acute cough: Secondary | ICD-10-CM

## 2023-04-24 DIAGNOSIS — R062 Wheezing: Secondary | ICD-10-CM

## 2023-04-24 DIAGNOSIS — R112 Nausea with vomiting, unspecified: Secondary | ICD-10-CM

## 2023-04-24 LAB — POCT URINALYSIS DIP (MANUAL ENTRY)
Bilirubin, UA: NEGATIVE
Blood, UA: NEGATIVE
Glucose, UA: NEGATIVE mg/dL
Ketones, POC UA: NEGATIVE mg/dL
Leukocytes, UA: NEGATIVE
Nitrite, UA: NEGATIVE
Protein Ur, POC: NEGATIVE mg/dL
Spec Grav, UA: >=1.03 — AB
Urobilinogen, UA: 0.2 U/dL
pH, UA: 6

## 2023-04-24 LAB — POC COVID19/FLU A&B COMBO
Covid Antigen, POC: NEGATIVE
Influenza A Antigen, POC: NEGATIVE
Influenza B Antigen, POC: NEGATIVE

## 2023-04-24 MED ORDER — ONDANSETRON 4 MG PO TBDP
4.0000 mg | ORAL_TABLET | Freq: Once | ORAL | Status: AC
  Administered 2023-04-24: 4 mg via ORAL

## 2023-04-24 MED ORDER — PREDNISONE 20 MG PO TABS: 40.0000 mg | ORAL_TABLET | Freq: Every day | ORAL | 0 refills | Status: AC

## 2023-04-24 MED ORDER — ALBUTEROL SULFATE HFA 108 (90 BASE) MCG/ACT IN AERS: 2.0000 | INHALATION_SPRAY | Freq: Once | RESPIRATORY_TRACT | Status: DC

## 2023-04-24 MED ORDER — ONDANSETRON 4 MG PO TBDP: 4.0000 mg | ORAL_TABLET | Freq: Three times a day (TID) | ORAL | 0 refills | Status: DC | PRN

## 2023-04-24 MED ORDER — ALBUTEROL SULFATE HFA 108 (90 BASE) MCG/ACT IN AERS: 1.0000 | INHALATION_SPRAY | Freq: Four times a day (QID) | RESPIRATORY_TRACT | 0 refills | Status: AC | PRN

## 2023-04-24 MED ORDER — AEROCHAMBER PLUS FLO-VU LARGE MISC: 1.0000 | Freq: Once | Status: DC

## 2023-04-24 NOTE — ED Provider Notes (Signed)
[facility]    CSN: [id number] Arrival date & time: 04/24/23  1352      History   Chief Complaint Chief Complaint  Patient presents with   Emesis   Diarrhea    HPI Christopher Hughes is a 58 y.o. male.   Patient presents today with a 4 to 5-day history of URI symptoms.  Reports subjective fever, cough, congestion, sore throat, nausea, vomiting, diarrhea.  Denies any chest pain or shortness of breath.  He denies any melena, hematochezia, hematemesis.  He has had difficulty eating as a result of the nausea with last episode of vomiting earlier today.  He reports abdominal discomfort is rated 6 on a 0-10 pain scale, described as cramping, generalized or to his abdomen, no alleviating factors identified.  Denies any known sick contacts but is around a lot of people.  He has had COVID several years ago but not more recently.  Denies any recent antibiotics or steroids.  He denies any significant past medical history including diabetes, hypertension, cardiovascular disease, asthma, COPD.  Does report that he does not see a doctor on a regular basis.  He does smoke.  He is having difficulty with his daily activities as a result of symptoms.    Past Medical History:  Diagnosis Date   Carpal tunnel syndrome    Herniated cervical disc     Patient Active Problem List   Diagnosis Date Noted   MDD (major depressive disorder), recurrent episode, severe (HCC) 11/14/2021   Cocaine use disorder (HCC) 11/14/2021   Suicidal ideations 11/14/2021   Opioid use disorder 11/07/2021    History reviewed. No pertinent surgical history.     Home Medications    Prior to Admission medications   Medication Sig Start Date End Date Taking? Authorizing Provider  albuterol (VENTOLIN HFA) 108 (90 Base) MCG/ACT inhaler Inhale 1-2 puffs into the lungs every 6 (six) hours as needed for wheezing or shortness of breath. 04/24/23  Yes Zela Sobieski K, PA-C  ondansetron (ZOFRAN-ODT) 4 MG disintegrating  tablet Take 1 tablet (4 mg total) by mouth every 8 (eight) hours as needed for nausea or vomiting. 04/24/23  Yes Burlene Montecalvo K, PA-C  predniSONE (DELTASONE) 20 MG tablet Take 2 tablets (40 mg total) by mouth daily for 4 days. 04/24/23 04/28/23 Yes Johnson Arizola K, PA-C  gabapentin (NEURONTIN) 100 MG capsule Take 1 capsule (100 mg total) by mouth 3 (three) times daily. 11/11/21 12/11/21  Park Pope, MD  hydrOXYzine (ATARAX) 25 MG tablet Take 1 tablet (25 mg total) by mouth 3 (three) times daily as needed for anxiety. 11/11/21   Park Pope, MD  nicotine (NICODERM CQ - DOSED IN MG/24 HOURS) 14 mg/24hr patch Place 1 patch (14 mg total) onto the skin daily. 11/12/21   Park Pope, MD  Phenylephrine-Acetaminophen (TYLENOL SINUS CONGESTION/PAIN PO) Take 2 tablets by mouth every 4 (four) hours as needed (For sinus congestion. Do not take more than 10 in 24 hours).    [provider]  traZODone (DESYREL) 50 MG tablet Take 1 tablet (50 mg total) by mouth at bedtime as needed for sleep. 11/11/21   Park Pope, MD    Family History History reviewed. No pertinent family history.  Social History Social History   Tobacco Use   Smoking status: Every Day    Current packs/day: 0.50    Types: Cigarettes   Smokeless tobacco: Never  Vaping Use   Vaping status: Former  Substance Use Topics   Alcohol use: Yes  Alcohol/week: 35.0 standard drinks of alcohol    Types: 35 Cans of beer per week    Comment: pt drinks 1/5 of liquor a day   Drug use: Yes     Allergies   Codeine   Review of Systems Review of Systems  Constitutional:  Positive for activity change. Negative for appetite change, fatigue and fever.  HENT:  Positive for congestion, postnasal drip and sore throat. Negative for sinus pressure and sneezing.   Respiratory:  Positive for cough. Negative for shortness of breath.   Cardiovascular:  Negative for chest pain.  Gastrointestinal:  Positive for abdominal pain, diarrhea, nausea and vomiting.   Musculoskeletal:  Positive for arthralgias and myalgias.  Neurological:  Positive for headaches. Negative for dizziness and light-headedness.     Physical Exam Triage Vital Signs ED Triage Vitals  Encounter Vitals Group     BP 04/24/23 1539 (!) 157/95     Systolic BP Percentile --      Diastolic BP Percentile --      Pulse Rate 04/24/23 1539 80     Resp 04/24/23 1539 20     Temp 04/24/23 1539 97.7 F (36.5 C)     Temp Source 04/24/23 1539 Oral     SpO2 04/24/23 1539 96 %     Weight --      Height --      Head Circumference --      Peak Flow --      Pain Score 04/24/23 1546 0     Pain Loc --      Pain Education --      Exclude from Growth Chart --    No data found.  Updated Vital Signs BP (!) 157/95 (BP Location: Left Arm)   Pulse 80   Temp 97.7 F (36.5 C) (Oral)   Resp 19   SpO2 96%   Visual Acuity Right Eye Distance:   Left Eye Distance:   Bilateral Distance:    Right Eye Near:   Left Eye Near:    Bilateral Near:     Physical Exam Vitals reviewed.  Constitutional:      General: He is awake.     Appearance: Normal appearance. He is well-developed. He is not ill-appearing.     Comments: Very pleasant male appears stated age in no acute distress sitting comfortably in exam room  HENT:     Head: Normocephalic and atraumatic.     Right Ear: Tympanic membrane, ear canal and external ear normal. Tympanic membrane is not erythematous or bulging.     Left Ear: Tympanic membrane, ear canal and external ear normal. Tympanic membrane is not erythematous or bulging.     Nose: Nose normal.     Mouth/Throat:     Pharynx: Uvula midline. Postnasal drip present. No oropharyngeal exudate, posterior oropharyngeal erythema or uvula swelling.  Cardiovascular:     Rate and Rhythm: Normal rate and regular rhythm.     Heart sounds: Normal heart sounds, S1 normal and S2 normal. No murmur heard. Pulmonary:     Effort: Pulmonary effort is normal. No accessory muscle usage or  respiratory distress.     Breath sounds: No stridor. Wheezing present. No rhonchi or rales.  Abdominal:     General: Bowel sounds are normal.     Palpations: Abdomen is soft.     Tenderness: There is no abdominal tenderness.  Neurological:     Mental Status: He is alert.  Psychiatric:  Behavior: Behavior is cooperative.      UC Treatments / Results  Labs (all labs ordered are listed, but only abnormal results are displayed) Labs Reviewed  POCT URINALYSIS DIP (MANUAL ENTRY) - Abnormal; Notable for the following components:      Result Value   Spec Grav, UA >=1.030 (*)    All other components within normal limits  POC COVID19/FLU A&B COMBO - Normal  CBC WITH DIFFERENTIAL/PLATELET  COMPREHENSIVE METABOLIC PANEL    EKG   Radiology DG Chest 2 View Result Date: 04/24/2023 CLINICAL DATA:  wheezing, 5 days of cough, history of smoking EXAM: CHEST - 2 VIEW COMPARISON:  11/14/2021 FINDINGS: Normal heart size. Stable mediastinal contours. Bronchial thickening without focal airspace disease. No pulmonary edema, pleural effusion, or pneumothorax. On limited assessment, no acute osseous findings. IMPRESSION: Bronchial thickening without focal airspace disease. Electronically Signed   By: Narda Rutherford M.D.   On: 04/24/2023 16:47    Procedures Procedures (including critical care time)  Medications Ordered in UC Medications  ondansetron (ZOFRAN-ODT) disintegrating tablet 4 mg (4 mg Oral Given 04/24/23 1615)    Initial Impression / Assessment and Plan / UC Course  I have reviewed the triage vital signs and the nursing notes.  Pertinent labs & imaging results that were available during my care of the patient were reviewed by me and considered in my medical decision making (see chart for details).     Patient is well-appearing, afebrile, nontoxic, nontachycardic.  He was given Zofran in clinic and able to pass oral challenge by eating crackers and drinking fluids without  recurrent vomiting.  UA was obtained that showed high specific gravity but no evidence of infection.  He was encouraged to push fluids and use Zofran on a scheduled basis for the next 24 hours then decrease use to every 8 hours as needed thereafter.  COVID and flu testing were negative.  We discussed likely viral etiology of symptoms.  No evidence of acute infection on physical exam that would warrant initiation of antibiotics.  Chest x-ray was obtained given his wheezing that showed hyperinflation but no evidence of focal consolidation based on my primary read.  We were waiting for radiologist overread at the time of discharge and we will contact him if this differs and changes our treatment plan.  Given his ongoing nausea/vomiting with increased specific gravity of urine we will obtain CBC and CMP for further evaluation.  We will contact him if these are abnormal.  He was started on albuterol every 4-6 hours as needed as well as a prednisone burst.  Discussed that he is not to take NSAIDs with prednisone but can use over-the-counter medication including Tylenol, Mucinex, Flonase.  If his symptoms are not improving within a few days he is to return for reevaluation.  Discussed that if anything worsens and he has nausea/vomiting despite medication, chest pain, shortness of breath, weakness he needs to be seen immediately.  Strict return precautions given.  Work excuse note provided.  Final Clinical Impressions(s) / UC Diagnoses   Final diagnoses:  Acute cough  Wheezing  Nausea and vomiting, unspecified vomiting type  Viral illness     Discharge Instructions      You do not have COVID or flu.  I do not see any evidence of pneumonia on your chest x-ray but we will contact you if the radiologist sees something I did not.  Use albuterol every 4-6 hours as needed for shortness of breath and coughing fits.  Start prednisone  40 mg for 4 days.  Do not take NSAIDs with this medication including aspirin,  ibuprofen/Advil, naproxen/Aleve.  Use Zofran every 8 hours as needed for nausea and vomiting.  Eat a bland diet and drink plenty of fluids.  If your symptoms are not improving within a few days please return for reevaluation.  If anything worsens and you have worsening cough, shortness of breath, high fever, nausea/vomiting despite the medication, chest pain, abdominal pain you need to go to the emergency room.     ED Prescriptions     Medication Sig Dispense Auth. Provider   albuterol (VENTOLIN HFA) 108 (90 Base) MCG/ACT inhaler Inhale 1-2 puffs into the lungs every 6 (six) hours as needed for wheezing or shortness of breath. 18 g Murrel Bertram K, PA-C   predniSONE (DELTASONE) 20 MG tablet Take 2 tablets (40 mg total) by mouth daily for 4 days. 8 tablet Kaya Klausing K, PA-C   ondansetron (ZOFRAN-ODT) 4 MG disintegrating tablet Take 1 tablet (4 mg total) by mouth every 8 (eight) hours as needed for nausea or vomiting. 20 tablet Meta Kroenke, Noberto Retort, PA-C      PDMP not reviewed this encounter.   Jeani Hawking, PA-C 04/24/23 1650

## 2023-04-24 NOTE — ED Triage Notes (Addendum)
Sx since Monday  Emesis this morning Diarrhea last time this morning   Drinking is ok. Tried eating today

## 2023-04-24 NOTE — Discharge Instructions (Signed)
You do not have COVID or flu.  I do not see any evidence of pneumonia on your chest x-ray but we will contact you if the radiologist sees something I did not.  Use albuterol every 4-6 hours as needed for shortness of breath and coughing fits.  Start prednisone 40 mg for 4 days.  Do not take NSAIDs with this medication including aspirin, ibuprofen/Advil, naproxen/Aleve.  Use Zofran every 8 hours as needed for nausea and vomiting.  Eat a bland diet and drink plenty of fluids.  If your symptoms are not improving within a few days please return for reevaluation.  If anything worsens and you have worsening cough, shortness of breath, high fever, nausea/vomiting despite the medication, chest pain, abdominal pain you need to go to the emergency room.

## 2023-04-25 LAB — CBC WITH DIFFERENTIAL/PLATELET
Basophils Absolute: 0.1 10*3/uL (ref 0.0–0.2)
Basos: 1 %
EOS (ABSOLUTE): 0.2 10*3/uL (ref 0.0–0.4)
Eos: 2 %
Hematocrit: 50.6 % (ref 37.5–51.0)
Hemoglobin: 17 g/dL (ref 13.0–17.7)
Immature Grans (Abs): 0 10*3/uL (ref 0.0–0.1)
Immature Granulocytes: 0 %
Lymphocytes Absolute: 2.4 10*3/uL (ref 0.7–3.1)
Lymphs: 26 %
MCH: 31.5 pg (ref 26.6–33.0)
MCHC: 33.6 g/dL (ref 31.5–35.7)
MCV: 94 fL (ref 79–97)
Monocytes Absolute: 0.6 10*3/uL (ref 0.1–0.9)
Monocytes: 6 %
Neutrophils Absolute: 6 10*3/uL (ref 1.4–7.0)
Neutrophils: 65 %
Platelets: 250 10*3/uL (ref 150–450)
RBC: 5.39 x10E6/uL (ref 4.14–5.80)
RDW: 12.8 % (ref 11.6–15.4)
WBC: 9.3 10*3/uL (ref 3.4–10.8)

## 2023-04-25 LAB — COMPREHENSIVE METABOLIC PANEL
ALT: 17 [IU]/L (ref 0–44)
AST: 17 [IU]/L (ref 0–40)
Albumin: 4.7 g/dL (ref 3.8–4.9)
Alkaline Phosphatase: 73 [IU]/L (ref 44–121)
BUN/Creatinine Ratio: 18 (ref 9–20)
BUN: 17 mg/dL (ref 6–24)
Bilirubin Total: 0.3 mg/dL (ref 0.0–1.2)
CO2: 23 mmol/L (ref 20–29)
Calcium: 9.4 mg/dL (ref 8.7–10.2)
Chloride: 103 mmol/L (ref 96–106)
Creatinine, Ser: 0.97 mg/dL (ref 0.76–1.27)
Globulin, Total: 3 g/dL (ref 1.5–4.5)
Glucose: 94 mg/dL (ref 70–99)
Potassium: 4.5 mmol/L (ref 3.5–5.2)
Sodium: 140 mmol/L (ref 134–144)
Total Protein: 7.7 g/dL (ref 6.0–8.5)
eGFR: 90 mL/min/{1.73_m2} (ref >59)

## 2023-04-26 ENCOUNTER — Encounter: Payer: Self-pay | Admitting: Physician Assistant

## 2023-08-18 ENCOUNTER — Emergency Department (HOSPITAL_COMMUNITY): Payer: Self-pay

## 2023-08-18 ENCOUNTER — Other Ambulatory Visit: Payer: Self-pay

## 2023-08-18 ENCOUNTER — Encounter (HOSPITAL_COMMUNITY): Payer: Self-pay

## 2023-08-18 ENCOUNTER — Emergency Department (HOSPITAL_COMMUNITY)
Admission: EM | Admit: 2023-08-18 | Discharge: 2023-08-18 | Disposition: A | Discharge status: Home / Self Care | Payer: Self-pay | Attending: Emergency Medicine | Admitting: Emergency Medicine

## 2023-08-18 DIAGNOSIS — M5412 Radiculopathy, cervical region: Secondary | ICD-10-CM | POA: Insufficient documentation

## 2023-08-18 LAB — CBC
HCT: 45.5 % (ref 39.0–52.0)
Hemoglobin: 15.4 g/dL (ref 13.0–17.0)
MCH: 32.4 pg (ref 26.0–34.0)
MCHC: 33.8 g/dL (ref 30.0–36.0)
MCV: 95.6 fL (ref 80.0–100.0)
Platelets: 239 10*3/uL (ref 150–400)
RBC: 4.76 MIL/uL (ref 4.22–5.81)
RDW: 12.9 % (ref 11.5–15.5)
WBC: 9.6 10*3/uL (ref 4.0–10.5)
nRBC: 0 % (ref 0.0–0.2)

## 2023-08-18 LAB — BASIC METABOLIC PANEL WITH GFR
Anion gap: 8 (ref 5–15)
BUN: 21 mg/dL — ABNORMAL HIGH (ref 6–20)
CO2: 22 mmol/L (ref 22–32)
Calcium: 8.7 mg/dL — ABNORMAL LOW (ref 8.9–10.3)
Chloride: 107 mmol/L (ref 98–111)
Creatinine, Ser: 1.24 mg/dL (ref 0.61–1.24)
GFR, Estimated: >60 mL/min (ref >60)
Glucose, Bld: 151 mg/dL — ABNORMAL HIGH (ref 70–99)
Potassium: 3.8 mmol/L (ref 3.5–5.1)
Sodium: 137 mmol/L (ref 135–145)

## 2023-08-18 LAB — DIFFERENTIAL
Abs Immature Granulocytes: 0.04 10*3/uL (ref 0.00–0.07)
Basophils Absolute: 0.1 10*3/uL (ref 0.0–0.1)
Basophils Relative: 1 %
Eosinophils Absolute: 0.3 10*3/uL (ref 0.0–0.5)
Eosinophils Relative: 4 %
Immature Granulocytes: 0 %
Lymphocytes Relative: 18 %
Lymphs Abs: 1.7 10*3/uL (ref 0.7–4.0)
Monocytes Absolute: 0.4 10*3/uL (ref 0.1–1.0)
Monocytes Relative: 5 %
Neutro Abs: 7 10*3/uL (ref 1.7–7.7)
Neutrophils Relative %: 72 %

## 2023-08-18 LAB — HEPATIC FUNCTION PANEL
ALT: 18 U/L (ref 0–44)
AST: 19 U/L (ref 15–41)
Albumin: 3.8 g/dL (ref 3.5–5.0)
Alkaline Phosphatase: 55 U/L (ref 38–126)
Bilirubin, Direct: <0.1 mg/dL (ref 0.0–0.2)
Total Bilirubin: 0.5 mg/dL (ref 0.0–1.2)
Total Protein: 7.2 g/dL (ref 6.5–8.1)

## 2023-08-18 LAB — ETHANOL: Alcohol, Ethyl (B): <15 mg/dL (ref <15)

## 2023-08-18 LAB — TROPONIN I (HIGH SENSITIVITY): Troponin I (High Sensitivity): 3 ng/L (ref <18)

## 2023-08-18 LAB — PROTIME-INR
INR: 1 (ref 0.8–1.2)
Prothrombin Time: 13 s (ref 11.4–15.2)

## 2023-08-18 LAB — APTT: aPTT: 27 s (ref 24–36)

## 2023-08-18 MED ORDER — METHOCARBAMOL 500 MG PO TABS: 1000.0000 mg | ORAL_TABLET | Freq: Four times a day (QID) | ORAL | 0 refills | Status: DC

## 2023-08-18 MED ORDER — PREDNISONE 20 MG PO TABS: ORAL_TABLET | ORAL | 0 refills | Status: DC

## 2023-08-18 MED ORDER — DEXAMETHASONE SODIUM PHOSPHATE 10 MG/ML IJ SOLN
10.0000 mg | Freq: Once | INTRAMUSCULAR | Status: AC
  Administered 2023-08-18: 10 mg via INTRAVENOUS
  Filled 2023-08-18: qty 1

## 2023-08-18 NOTE — Discharge Instructions (Addendum)
 Please read and follow all provided instructions.  Your diagnoses today include:  1. Cervical radiculopathy    Tests performed today include: An EKG of your heart: No sign of heart attack A chest x-ray Cardiac enzymes: none Blood counts and electrolytes: Blood sugar was slightly elevated at 151 MRI brain: does not show a stroke MRI cervical spine: shows pinched nerves, more severe on the left side Vital signs. See below for your results today.   Medications prescribed:  Prednisone  - steroid medicine   It is best to take this medication in the morning to prevent sleeping problems. If you are diabetic, monitor your blood sugar closely and stop taking Prednisone  if blood sugar is over 300. Take with food to prevent stomach upset.   Robaxin  (methocarbamol ) - muscle relaxer medication  DO NOT drive or perform any activities that require you to be awake and alert because this medicine can make you drowsy.   Take any prescribed medications only as directed.  Follow-up instructions:  Call Dr. Lawyer Pride office tomorrow.  Tell them that you were seen in the emergency department and had an MRI of your neck.  Tell them that you are weak in your left arm and were told to follow-up.   Return instructions:  SEEK IMMEDIATE MEDICAL ATTENTION IF: You have severe chest pain, especially if the pain is crushing or pressure-like and spreads to the arms, back, neck, or jaw, or if you have sweating, nausea or vomiting, or trouble with breathing. THIS IS AN EMERGENCY. Do not wait to see if the pain will go away. Get medical help at once. Call 911. DO NOT drive yourself to the hospital.  Return if you have worsening weakness in your arms or legs, slurred speech, trouble walking or talking, confusion, or trouble with your balance.  Return if you have the weakness in your left arm progresses   Your vital signs today were: BP 123/69 (BP Location: Right Arm)   Pulse 65   Temp 98 F (36.7 C) (Oral)   Resp  (!) 21   SpO2 96%  If your blood pressure (BP) was elevated above 135/85 this visit, please have this repeated by your doctor within one month. --------------

## 2023-08-18 NOTE — ED Provider Notes (Signed)
 [place] [facility]GENC[facility] DE[facility]MENT AT [facility] Provider Note   CSN: [id number] Arrival date & time: 08/18/23  1418     History  Chief Complaint  Patient presents with   Arm Pain   Chest Pain    Christopher Hughes is a 58 y.o. male.  Patient presents to the emergency department today for evaluation of left upper extremity weakness, pain and numbness as well as chest pain and shortness of breath.  Patient does not have any chronic medical conditions other than a history of substance abuse (alcohol, opiates, cocaine).  Does not regularly go to a doctor.  He does smoke.  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  He is in the woods at times.  Last week he removed several ticks off of his lower extremities including the left groin.  Approximately Wednesday of last week (today is Tuesday) he developed a tingling sensation in the left shoulder and upper arm with associated pain.  This then extended down the left arm.  He developed weakness.  He has been unable to use this arm to help with ADLs and grasping objects.  He has not worked since his symptoms began.  He also reports some generalized chest pain at times and shortness of breath without cough or fever.  Family at bedside stated that facial looked like it was drooping on the left side slightly today, prompting emergency department visit.  Patient has had a couple small red bumps at the site of the tick bites, but no rash.  He did have to remove the head of a tick in the groin area by applying alcohol and scraping with a razor blade.  He has had a headache for several days.  No fever or neck pain.  No confusion.       Home Medications Prior to Admission medications   Medication Sig Start Date End Date Taking? Authorizing Provider  albuterol  (VENTOLIN  HFA) 108 (90 Base) MCG/ACT inhaler Inhale 1-2 puffs into the lungs every 6 (six) hours as needed for wheezing or shortness of breath. 04/24/23   Raspet, Erin K, PA-C  gabapentin   (NEURONTIN ) 100 MG capsule Take 1 capsule (100 mg total) by mouth 3 (three) times daily. 11/11/21 12/11/21  Augusta Blizzard, MD  hydrOXYzine  (ATARAX ) 25 MG tablet Take 1 tablet (25 mg total) by mouth 3 (three) times daily as needed for anxiety. 11/11/21   Augusta Blizzard, MD  nicotine  (NICODERM CQ  - DOSED IN MG/24 HOURS) 14 mg/24hr patch Place 1 patch (14 mg total) onto the skin daily. 11/12/21   Augusta Blizzard, MD  ondansetron  (ZOFRAN -ODT) 4 MG disintegrating tablet Take 1 tablet (4 mg total) by mouth every 8 (eight) hours as needed for nausea or vomiting. 04/24/23   Raspet, Erin K, PA-C  Phenylephrine-Acetaminophen  (TYLENOL  SINUS CONGESTION/PAIN PO) Take 2 tablets by mouth every 4 (four) hours as needed (For sinus congestion. Do not take more than 10 in 24 hours).    [provider]  traZODone  (DESYREL ) 50 MG tablet Take 1 tablet (50 mg total) by mouth at bedtime as needed for sleep. 11/11/21   Augusta Blizzard, MD      Allergies    Codeine    Review of Systems   Review of Systems  Physical Exam Updated Vital Signs BP (!) 138/99 (BP Location: Right Arm)   Pulse 89   Temp 98.2 F (36.8 C) (Oral)   Resp 18   SpO2 97%  Physical Exam Vitals and nursing note reviewed.  Constitutional:  Appearance: He is well-developed.  HENT:     Head: Normocephalic and atraumatic.     Right Ear: Tympanic membrane, ear canal and external ear normal.     Left Ear: Tympanic membrane, ear canal and external ear normal.     Nose: Nose normal.     Mouth/Throat:     Pharynx: Uvula midline.  Eyes:     General: Lids are normal.     Conjunctiva/sclera: Conjunctivae normal.     Pupils: Pupils are equal, round, and reactive to light.  Neck:     Comments: No significant carotid bruits auscultated Cardiovascular:     Rate and Rhythm: Normal rate and regular rhythm.     Pulses:          Radial pulses are 2+ on the right side and 2+ on the left side.  Pulmonary:     Effort: Pulmonary effort is normal.     Breath  sounds: Normal breath sounds.  Abdominal:     Palpations: Abdomen is soft.     Tenderness: There is no abdominal tenderness.  Musculoskeletal:        General: Normal range of motion.     Cervical back: Normal range of motion and neck supple. No tenderness or bony tenderness.  Skin:    General: Skin is warm and dry.  Neurological:     Mental Status: He is alert and oriented to person, place, and time.     GCS: GCS eye subscore is 4. GCS verbal subscore is 5. GCS motor subscore is 6.     Cranial Nerves: No cranial nerve deficit, dysarthria or facial asymmetry.     Sensory: Sensation is intact.     Motor: Weakness present. No abnormal muscle tone.     Coordination: Coordination normal.     Gait: Gait normal.     Comments: Left upper extremity grip weak, weak flexion extension of the wrist, mildly weak flexion at the elbow.  Movements make the pain worse.  After examining his upper extremity, patient states that his "arm felt dead".     ED Results / Procedures / Treatments   Labs (all labs ordered are listed, but only abnormal results are displayed) Labs Reviewed  BASIC METABOLIC PANEL WITH GFR - Abnormal; Notable for the following components:      Result Value   Glucose, Bld 151 (*)    BUN 21 (*)    Calcium 8.7 (*)    All other components within normal limits  CBC  PROTIME-INR  APTT  ETHANOL  HEPATIC FUNCTION PANEL  DIFFERENTIAL  TROPONIN I (HIGH SENSITIVITY)    EKG EKG Interpretation Date/Time:  Tuesday Aug 18 2023 14:32:08 EDT Ventricular Rate:  82 PR Interval:  145 QRS Duration:  101 QT Interval:  379 QTC Calculation: 443 R Axis:   -25  Text Interpretation: Sinus rhythm Probable left atrial enlargement Borderline left axis deviation when compared to prior, similar appearance No STEMI Confirmed by Wynell Heath ([id number]) on 08/18/2023 3:07:04 PM  Radiology DG Chest 2 View Result Date: 08/18/2023 CLINICAL DATA:  chest pain EXAM: CHEST - 2 VIEW COMPARISON:  04/24/2023  FINDINGS: Lungs are clear.  No pneumothorax. Heart size normal. No effusion. Vertebral endplate spurring at multiple levels in the lower thoracic spine. IMPRESSION: No acute cardiopulmonary disease. Electronically Signed   By: Nicoletta Barrier M.D.   On: 08/18/2023 16:59    Procedures Procedures    Medications Ordered in ED Medications - No data to display  ED Course/  Medical Decision Making/ A&P    Patient seen and examined. History obtained directly from patient.  Will need to evaluate for stroke, ACS, cervical radiculopathy.  Patient likely has several undefined risk factors.  Labs/EKG: In triage, standard cardiac workup labs were ordered.  I have added on stroke workup labs.  Imaging: Ordered chest x-ray, MR brain and cervical spine  Medications/Fluids: None ordered  Most recent vital signs reviewed and are as follows: BP (!) 138/99 (BP Location: Right Arm)   Pulse 89   Temp 98.2 F (36.8 C) (Oral)   Resp 18   SpO2 97%   Initial impression: Left upper extremity weakness and sensation change, question of facial droop at home, not currently present, vague chest pain.  Recent tick bites, unclear if these are related.  Considered tick paralysis however symptoms are less generalized (typically ascending paralysis) and also sensory component which would make this less likely.  5:06 PM Reassessment performed. Patient appears stable.   Labs personally reviewed and interpreted including: CBC unremarkable; BMP glucose 151, normal kidney function; hepatic function panel unremarkable; PT/INR and APTT ordered as part of stroke order set were normal; ethanol negative; troponin normal at 3.  Imaging personally visualized and interpreted including: Chest x-ray agree negative.  Reviewed pertinent lab work and imaging with patient at bedside. Questions answered.   Most current vital signs reviewed and are as follows: BP (!) 138/99 (BP Location: Right Arm)   Pulse 89   Temp 98.2 F (36.8 C)  (Oral)   Resp 18   SpO2 97%   Plan: Awaiting MRI.   9:31 PM Reassessment performed. Patient appears stable.  Patient had the MRI performed and then there was a delay in obtaining imaging results.  Discussed results with Dr. Linder Revere.  Agrees with plan for steroids, muscle relaxer, outpatient neurosurgical follow-up.  Imaging personally visualized and interpreted including: MRI brain agree no obvious stroke; MRI cervical spine shows several areas of severe foraminal stenosis, left greater than the right.  This is likely the etiology of his symptoms.  Reviewed pertinent lab work and imaging with patient at bedside. Questions answered.   Most current vital signs reviewed and are as follows: BP 123/69 (BP Location: Right Arm)   Pulse 65   Temp 98 F (36.7 C) (Oral)   Resp (!) 21   SpO2 96%   Plan: Discharge to home.  Will give dose of Decadron prior to discharge.  Prescriptions written for: Prednisone  12-day taper to start tomorrow, Robaxin   Patient counseled on proper use of muscle relaxant medication.  They were told not to drink alcohol, drive any vehicle, or do any dangerous activities while taking this medication.  Patient verbalized understanding.  Other home care instructions discussed: Rest  ED return instructions discussed: Worsening symptoms, inability to use arm or progressive weakness.  Follow-up instructions discussed: Patient encouraged to follow-up with neurosurgery next 1 week.                                 Medical Decision Making Amount and/or Complexity of Data Reviewed Labs: ordered. Radiology: ordered.  Risk Prescription drug management.   Patient presents for evaluation of progressive left upper extremity weakness ongoing over the past week.  Lab workup today overall reassuring, mild hyperglycemia otherwise unremarkable.  No indication of ACS or MI.  Chest x-ray is clear.  Patient was evaluated for CVA with MRI of the brain which was negative.  He  was  also evaluated for cervical radiculopathy with MRI of the neck.  This has multiple areas of foraminal stenosis, worse on the left.  This is likely the etiology of his symptoms.  There is no central cord involvement or central disc bulges.  This is consistent with this patient's symptoms, no lower extremity symptoms.  Given motor involvement, patient will need close outpatient follow-up with neurosurgery.  He will be given referral information.  Discussed signs and symptoms to return as above.  The patient's vital signs, pertinent lab work and imaging were reviewed and interpreted as discussed in the ED course. Hospitalization was considered for further testing, treatments, or serial exams/observation. However as patient is well-appearing, has a stable exam, and reassuring studies today, I do not feel that they warrant admission at this time. This plan was discussed with the patient who verbalizes agreement and comfort with this plan and seems reliable and able to return to the Emergency Department with worsening or changing symptoms.           Final Clinical Impression(s) / ED Diagnoses Final diagnoses:  Cervical radiculopathy    Rx / DC Orders ED Discharge Orders          Ordered    predniSONE  (DELTASONE ) 20 MG tablet        08/18/23 2123    methocarbamol  (ROBAXIN ) 500 MG tablet  4 times daily        08/18/23 2123              Lyna Sandhoff, PA-C 08/18/23 2135    Rolinda Climes, DO 08/18/23 2345

## 2023-08-18 NOTE — ED Triage Notes (Signed)
 Pt arrives via POV. Pt reports since last Wednesday he has been experiencing constant pain and numbness in his left shoulder and arm. He reports intermittent chest pain and sob since Wednesday as well.   Pt reports he also had several tick bites on his left leg and lower abdomen last week. Pt is AxOx4.

## 2023-11-11 ENCOUNTER — Ambulatory Visit: Payer: MEDICAID | Admitting: Orthopedic Surgery

## 2023-12-16 ENCOUNTER — Ambulatory Visit: Payer: MEDICAID | Admitting: Orthopedic Surgery

## 2023-12-16 ENCOUNTER — Other Ambulatory Visit (INDEPENDENT_AMBULATORY_CARE_PROVIDER_SITE_OTHER): Payer: MEDICAID

## 2023-12-16 VITALS — BP 124/81 | HR 89 | Ht 69.0 in | Wt 177.2 lb

## 2023-12-16 DIAGNOSIS — Z720 Tobacco use: Secondary | ICD-10-CM | POA: Diagnosis not present

## 2023-12-16 DIAGNOSIS — M542 Cervicalgia: Secondary | ICD-10-CM | POA: Diagnosis not present

## 2023-12-16 DIAGNOSIS — M5412 Radiculopathy, cervical region: Secondary | ICD-10-CM

## 2023-12-16 NOTE — Progress Notes (Signed)
 Orthopedic Spine Surgery Office Note  Assessment: Patient is a 58 y.o. male with cervical radiculopathy.  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 Discussed that the conservative treatments include:  -activity modification  -physical therapy  -over the counter pain medications  -medrol dosepak  -cervical steroid injections -Patient has tried Tylenol , Mobic, oral steroids, intramuscular steroid injections, gabapentin  -Recommended diagnostic/therapeutic cervical ESI.  Referral provided to him today -Patient will need to be nicotine  free prior to any elective spine surgery -Patient should return to office in 8 weeks, x-rays at next visit: none   I spent talking to the patient about the health risks associated with smoking and encouraged cessation. Explained that from a surgical perspective, smoking increases the risk of infection, wound healing complications, and nonunion. There is also a increased chance for need of additional procedures. Patient expressed understanding and is going to work on quitting.    ___________________________________________________________________________   History:  Patient is a 58 y.o. male who presents today for cervical spine.  Patient has had 4 months of neck pain that radiates in the left upper extremity.  He feels the going into the lateral shoulder, lateral arm, dorsal forearm, and into the hand.  He does not have any pain radiating into the right upper extremity.  There was no trauma or injury that preceded the onset of the pain.  Pain has been getting worse with time.  He rates the pain as a 10 out of 10 at its worst.  No bowel or bladder incontinence.  No saddle anesthesia.  He does notice some weakness in his left arm particularly with wrist extension.  Does not use any ambulatory  assist devices.  No unsteadiness with gait.  Sometimes drops objects with his left hand but no other clumsiness or difficulty with fine motor skills.  No issues with his right hand.  Treatments tried: Tylenol , Mobic, oral steroids, intramuscular steroid injections, gabapentin   Review of systems: Denies fevers and chills, night sweats, unexplained weight loss, history of cancer, pain that wakes them at night  Past medical history: Depression History of substance abuse  Allergies: codeine  Past surgical history:  None  Social history: Reports use of nicotine  product (smoking, vaping, patches, smokeless) Alcohol use: Yes, 1 drink per week Denies recreational drug use   Physical Exam:  BMI of 26.2  General: no acute distress, appears stated age Neurologic: alert, answering questions appropriately, following commands Respiratory: unlabored breathing on room air, symmetric chest rise Psychiatric: appropriate affect, normal cadence to speech   MSK (spine):  -Strength exam      Left  Right Grip strength                5/5  5/5 Interosseus   5/5   5/5 Wrist extension  5/5  5/5 Wrist flexion   5/5  5/5 Elbow flexion   5/5  5/5 Deltoid    5/5  5/5  -Sensory exam   Sensation intact to light touch in C5-T1 nerve distributions of bilateral upper extremities  -Brachioradialis DTR: 1/4 on the left, 1/4 on the right -Biceps DTR: 1/4 on the left, 1/4 on the right  -Spurling: negative bilaterally -Hoffman sign: negative -Clonus: no beats bilaterally -Interosseous wasting: none seen -Grip and release test: negative -Romberg: negative -Gait: normal  Left shoulder exam: no pain through range of motion  Right shoulder exam: no pain through range of motion  Imaging: XRs of the cervical spine from 12/16/2023 were independently reviewed and interpreted, showing disc at loss at C3/4, C5/6.  No other significant degenerative changes seen.  No evidence of instability on  flexion/extension views.  No fracture or dislocation seen.  MRI of the cervical spine from 08/18/2023 was independently reviewed and interpreted, showing left-sided foraminal stenosis at C3/4.  Bilateral foraminal stenosis at C5/6.  Left-sided foraminal stenosis at C6/7.  No other significant stenosis seen.  No T2 cord signal change seen.  On the mid sagittal cuts, there is CSF seen both ventrally and dorsally around the cord.   Patient name: Christopher Hughes Patient MRN: [id number] Date of visit: 12/16/23

## 2023-12-28 ENCOUNTER — Other Ambulatory Visit: Payer: Self-pay | Admitting: Orthopedic Surgery

## 2023-12-28 DIAGNOSIS — M5412 Radiculopathy, cervical region: Secondary | ICD-10-CM

## 2024-01-18 ENCOUNTER — Other Ambulatory Visit: Payer: Self-pay

## 2024-01-18 ENCOUNTER — Ambulatory Visit: Payer: MEDICAID | Attending: Internal Medicine

## 2024-01-18 DIAGNOSIS — M6281 Muscle weakness (generalized): Secondary | ICD-10-CM | POA: Insufficient documentation

## 2024-01-18 DIAGNOSIS — M542 Cervicalgia: Secondary | ICD-10-CM | POA: Diagnosis present

## 2024-01-18 DIAGNOSIS — M5412 Radiculopathy, cervical region: Secondary | ICD-10-CM | POA: Diagnosis not present

## 2024-01-18 DIAGNOSIS — R293 Abnormal posture: Secondary | ICD-10-CM | POA: Diagnosis present

## 2024-01-18 NOTE — Therapy (Signed)
 OUTPATIENT PHYSICAL THERAPY CERVICAL EVALUATION   Patient Name: Christopher Hughes MRN: [id number] DOB:1965-05-08, 58 y.o., male Today's Date: 01/18/2024  END OF SESSION:  PT End of Session - 01/18/24 1140     Visit Number 1    Number of Visits 17    Date for Recertification  03/14/24    Authorization Type [employer]    PT Start Time 1100    PT Stop Time 1134    PT Time Calculation (min) 34 min    Activity Tolerance Patient limited by pain    Behavior During Therapy [facility] for tasks assessed/performed          Past Medical History:  Diagnosis Date   Carpal tunnel syndrome    Herniated cervical disc    History reviewed. No pertinent surgical history. Patient Active Problem List   Diagnosis Date Noted   MDD (major depressive disorder), recurrent episode, severe (HCC) 11/14/2021   Cocaine use disorder (HCC) 11/14/2021   Suicidal ideations 11/14/2021   Opioid use disorder 11/07/2021    PCP: Austin Mutton, MD  REFERRING PROVIDER: Georgina Ozell LABOR, MD  REFERRING DIAG: [phone] (ICD-10-CM) - Radiculopathy, cervical region   THERAPY DIAG:  Cervicalgia  Muscle weakness (generalized)  Abnormal posture  Rationale for Evaluation and Treatment: Rehabilitation  ONSET DATE: May 2025  SUBJECTIVE:                                                                                                                                                                                                         SUBJECTIVE STATEMENT: Pt presents to PT with roughly 5 month hx of L sided cervical pain that radiates into L UE. No trauma or MOI, he woke up and his spouse that he was having a CVA because he could not move his L UE. Was denied a cervical epidural injection due to not having 6 weeks of PT. Note increasing hand clumsiness, even into R hand. Denies bowel or bladder changes or saddle anesthesia. Also denies gait disturbances, does feel significant N/T down anterior and lateral L UE.   Hand  dominance: Right  PERTINENT HISTORY:  See PMH  PAIN:  Are you having pain?  Yes: NPRS scale: 10/10 Worst: 10/10 Pain location: L cervical paraspinals, L upper trap, L UE Pain description: sharp, N/T  Aggravating factors: driving, turning head Relieving factors: none   PRECAUTIONS: None  RED FLAGS: None    WEIGHT BEARING RESTRICTIONS: No  FALLS:  Has patient fallen in last 6 months? No  LIVING ENVIRONMENT: Lives with: lives with their spouse Lives  in: House/apartment  OCCUPATION: Not currently working  PLOF: Independent  PATIENT GOALS: decrease neck pain, improve L shoulder motion and overall independence   NEXT MD VISIT: 02/10/2024  OBJECTIVE:  Note: Objective measures were completed at Evaluation unless otherwise noted.  DIAGNOSTIC FINDINGS:  See imaging   PATIENT SURVEYS:  NDI:  NECK DISABILITY INDEX  Date: 01/18/2024 Score  Pain intensity 4 = The pain is very severe at the moment  2. Personal care (washing, dressing, etc.) 1 =  I can look after myself normally but it causes extra pain  3. Lifting 5 = I cannot lift or carry anything   4. Reading 5 =  I cannot read at all  5. Headaches 5 = I have headaches almost all the time  6. Concentration 4 =  I have a great deal of difficulty in concentrating when I want to  7. Work 5 =  I can't do any work at all  8. Driving 5 = I can't drive my car at all  9. Sleeping 4 = My sleep is greatly disturbed (3-5 hrs sleepless)   10. Recreation 4 =  I can hardly do any recreation activities because of pain in my neck  Total 42/50   Minimum Detectable Change (90% confidence): 5 points or 10% points  COGNITION: Overall cognitive status: Within functional limits for tasks assessed  SENSATION: Light touch: Impaired - L UE C5-7 dermatomes  POSTURE: rounded shoulders, forward head, and L UE guarded against body  PALPATION: Significant TTP to L upper trap, L cervical paraspinals   CERVICAL ROM:   Active ROM A/PROM  (deg) eval  Flexion   Extension   Right lateral flexion   Left lateral flexion   Right rotation 60  Left rotation 43   (Blank rows = not tested)  UPPER EXTREMITY ROM:  Active ROM Right eval Left eval  Shoulder flexion 160 30  Shoulder extension    Shoulder abduction    Shoulder adduction    Shoulder extension    Shoulder internal rotation    Shoulder external rotation    Elbow flexion    Elbow extension    Wrist flexion    Wrist extension    Wrist ulnar deviation    Wrist radial deviation    Wrist pronation    Wrist supination     (Blank rows = not tested)  UPPER EXTREMITY MMT:  MMT Right eval Left eval  Shoulder flexion    Shoulder extension    Shoulder abduction    Shoulder adduction    Shoulder extension    Shoulder internal rotation    Shoulder external rotation    Middle trapezius    Lower trapezius    Elbow flexion    Elbow extension    Wrist flexion    Wrist extension    Wrist ulnar deviation    Wrist radial deviation    Wrist pronation    Wrist supination    Grip strength 100lb 25lb   (Blank rows = not tested)  CERVICAL SPECIAL TESTS:  Upper limb tension test (ULTT): Positive  FUNCTIONAL TESTS:  Deep Cervical Flexor Endurance: 5 seconds  TREATMENT: [facility] Adult PT Treatment:                                                DATE: 01/18/2024 Therapeutic Exercise: Seated L upper trap  stretch x 30  Supine chin tuck x 5 - 5 hold Scapular retraction - attempted Median nerve glide - attempted  PATIENT EDUCATION:  Education details: eval findings, NDI, HEP, POC Person educated: Patient Education method: Explanation, Demonstration, and Handouts Education comprehension: verbalized understanding and returned demonstration  HOME EXERCISE PROGRAM: Access Code: [email or web address] URL: [email or web address] Date: 01/18/2024 Prepared by: Alm Kingdom  Exercises - Seated Upper Trapezius Stretch (Mirrored)  - 1 x daily - 7 x weekly - 2-3  sets - 30 sec hold - Supine Chin Tuck  - 1 x daily - 7 x weekly - 2 sets - 10 reps - 5 sec hold  ASSESSMENT:  CLINICAL IMPRESSION: Patient is a 58 y.o. M who was seen today for physical therapy evaluation and treatment for acute on chronic neck pain with referral into L UE. Physical findings are consistent with MD impression as pt demonstrates decreased cervical ROM, palpable pain in cervical spine with referral down L UE, and decreased functional endurance of postural muscles. NDI score shows severe disability in performance of home ADLs and community activities. Pt would benefit from skilled PT services working on DNF and periscapular strengthening while addressing radicular symptoms down L UE.    OBJECTIVE IMPAIRMENTS: decreased activity tolerance, decreased endurance, decreased mobility, decreased ROM, decreased strength, impaired UE functional use, postural dysfunction, and pain.   ACTIVITY LIMITATIONS: carrying, lifting, sitting, standing, transfers, bed mobility, bathing, toileting, dressing, and reach over head  PARTICIPATION LIMITATIONS: meal prep, cleaning, laundry, driving, shopping, community activity, occupation, and yard work  PERSONAL FACTORS: Time since onset of injury/illness/exacerbation are also affecting patient's functional outcome.   REHAB POTENTIAL: Good  CLINICAL DECISION MAKING: Evolving/moderate complexity  EVALUATION COMPLEXITY: Moderate   GOALS: Goals reviewed with patient? No  SHORT TERM GOALS: Target date: 02/08/2024   Pt will be compliant and knowledgeable with initial HEP for improved comfort and carryover Baseline: initial HEP given  Goal status: INITIAL  2.  Pt will self report neck and L UE pain no greater than 7/10 for improved comfort and functional ability Baseline: 10/10 at worst Goal status: INITIAL   LONG TERM GOALS: Target date: 03/14/2024   Pt will decrease NDI disability score to no greater than 70% (35/50) as proxy for functional  improvement Baseline: 84% disability (42/50) Goal status: INITIAL   2.  Pt will self report neck and L UE pain no greater than 5/10 for improved comfort and functional ability Baseline: 10/10 at worst Goal status: INITIAL   3.  Pt will improve L sided cervical rotation AROM to no less than 60 degrees for improved functional ability and decreased pain Baseline: 42 degrees Goal status: INITIAL  4.  Pt will improve L hand grip strength to at least 60lbs for improved functional ability and decreased limitation Baseline: 25lbs Goal status: INITIAL    PLAN:  PT FREQUENCY: 1-2x/week  PT DURATION: 8 weeks  PLANNED INTERVENTIONS: 97164- PT Re-evaluation, 97110-Therapeutic exercises, 97530- Therapeutic activity, [id number]- Neuromuscular re-education, 97535- Self Care, 02859- Manual therapy, G0283- Electrical stimulation (unattended), [id number]- Electrical stimulation (manual), [id number]- Vasopneumatic device, [id number]- Traction (mechanical), [id number] (1-2 muscles), 20561 (3+ muscles)- Dry Needling, and Patient/Family education  PLAN FOR NEXT SESSION: assess HEP response, manual therapy, DNF/periscapular strength, nerve glides, mechanical traction if unable to find alleviation   Alm JAYSON Kingdom, PT 01/18/2024, 11:41 AM

## 2024-02-01 ENCOUNTER — Encounter: Payer: Self-pay | Admitting: Radiology

## 2024-02-02 ENCOUNTER — Ambulatory Visit: Payer: MEDICAID | Attending: Internal Medicine

## 2024-02-02 DIAGNOSIS — M6281 Muscle weakness (generalized): Secondary | ICD-10-CM | POA: Diagnosis present

## 2024-02-02 DIAGNOSIS — R293 Abnormal posture: Secondary | ICD-10-CM | POA: Diagnosis present

## 2024-02-02 DIAGNOSIS — M542 Cervicalgia: Secondary | ICD-10-CM | POA: Insufficient documentation

## 2024-02-02 NOTE — Therapy (Signed)
 OUTPATIENT PHYSICAL THERAPY CERVICAL TREATMENT   Patient Name: Christopher Hughes MRN: [id number] DOB:[birth year], 58 y.o., male Today's Date: 02/02/2024  END OF SESSION:  PT End of Session - 02/02/24 1531     Visit Number 2    Number of Visits 17    Date for Recertification  03/14/24    Authorization Type [employer]    Authorization Time Period 16 visits approved 12/31/23-03/14/24    Authorization - Visit Number 1    Authorization - Number of Visits 16    PT Start Time 1533    PT Stop Time 1600    PT Time Calculation (min) 27 min    Activity Tolerance Patient limited by pain    Behavior During Therapy [facility] for tasks assessed/performed           Past Medical History:  Diagnosis Date   Carpal tunnel syndrome    Herniated cervical disc    History reviewed. No pertinent surgical history. Patient Active Problem List   Diagnosis Date Noted   MDD (major depressive disorder), recurrent episode, severe (HCC) 11/14/2021   Cocaine use disorder (HCC) 11/14/2021   Suicidal ideations 11/14/2021   Opioid use disorder 11/07/2021    PCP: Austin Mutton, MD  REFERRING PROVIDER: Georgina Ozell LABOR, MD  REFERRING DIAG: [phone] (ICD-10-CM) - Radiculopathy, cervical region   THERAPY DIAG:  Cervicalgia  Muscle weakness (generalized)  Abnormal posture  Rationale for Evaluation and Treatment: Rehabilitation  ONSET DATE: May 2025  SUBJECTIVE:                                                                                                                                                                                                         SUBJECTIVE STATEMENT: Patient states that pain is 10/10 and getting worse. Patient states he is doing HEP and is getting better with it.  Eval: Pt presents to PT with roughly 5 month hx of L sided cervical pain that radiates into L UE. No trauma or MOI, he woke up and his spouse that he was having a CVA because he could not move his L UE. Was denied a  cervical epidural injection due to not having 6 weeks of PT. Note increasing hand clumsiness, even into R hand. Denies bowel or bladder changes or saddle anesthesia. Also denies gait disturbances, does feel significant N/T down anterior and lateral L UE.   Hand dominance: Right  PERTINENT HISTORY:  See PMH  PAIN:  Are you having pain?  Yes: NPRS scale: 10/10 Worst: 10/10 Pain location: L cervical paraspinals, L upper trap,  L UE Pain description: sharp, N/T  Aggravating factors: driving, turning head Relieving factors: none   PRECAUTIONS: None  RED FLAGS: None    WEIGHT BEARING RESTRICTIONS: No  FALLS:  Has patient fallen in last 6 months? No  LIVING ENVIRONMENT: Lives with: lives with their spouse Lives in: House/apartment  OCCUPATION: Not currently working  PLOF: Independent  PATIENT GOALS: decrease neck pain, improve L shoulder motion and overall independence   NEXT MD VISIT: 02/10/2024  OBJECTIVE:  Note: Objective measures were completed at Evaluation unless otherwise noted.  DIAGNOSTIC FINDINGS:  See imaging   PATIENT SURVEYS:  NDI:  NECK DISABILITY INDEX  Date: 01/18/2024 Score  Pain intensity 4 = The pain is very severe at the moment  2. Personal care (washing, dressing, etc.) 1 =  I can look after myself normally but it causes extra pain  3. Lifting 5 = I cannot lift or carry anything   4. Reading 5 =  I cannot read at all  5. Headaches 5 = I have headaches almost all the time  6. Concentration 4 =  I have a great deal of difficulty in concentrating when I want to  7. Work 5 =  I can't do any work at all  8. Driving 5 = I can't drive my car at all  9. Sleeping 4 = My sleep is greatly disturbed (3-5 hrs sleepless)   10. Recreation 4 =  I can hardly do any recreation activities because of pain in my neck  Total 42/50   Minimum Detectable Change (90% confidence): 5 points or 10% points  COGNITION: Overall cognitive status: Within functional limits  for tasks assessed  SENSATION: Light touch: Impaired - L UE C5-7 dermatomes  POSTURE: rounded shoulders, forward head, and L UE guarded against body  PALPATION: Significant TTP to L upper trap, L cervical paraspinals   CERVICAL ROM:   Active ROM A/PROM (deg) eval  Flexion   Extension   Right lateral flexion   Left lateral flexion   Right rotation 60  Left rotation 43   (Blank rows = not tested)  UPPER EXTREMITY ROM:  Active ROM Right eval Left eval  Shoulder flexion 160 30  Shoulder extension    Shoulder abduction    Shoulder adduction    Shoulder extension    Shoulder internal rotation    Shoulder external rotation    Elbow flexion    Elbow extension    Wrist flexion    Wrist extension    Wrist ulnar deviation    Wrist radial deviation    Wrist pronation    Wrist supination     (Blank rows = not tested)  UPPER EXTREMITY MMT:  MMT Right eval Left eval  Shoulder flexion    Shoulder extension    Shoulder abduction    Shoulder adduction    Shoulder extension    Shoulder internal rotation    Shoulder external rotation    Middle trapezius    Lower trapezius    Elbow flexion    Elbow extension    Wrist flexion    Wrist extension    Wrist ulnar deviation    Wrist radial deviation    Wrist pronation    Wrist supination    Grip strength 100lb 25lb   (Blank rows = not tested)  CERVICAL SPECIAL TESTS:  Upper limb tension test (ULTT): Positive  FUNCTIONAL TESTS:  Deep Cervical Flexor Endurance: 5 seconds  TREATMENT: [facility] Adult PT Treatment:  DATE: 02/02/24 Therapeutic Exercise: L upper trap stretch 2x10 ea BIL Scapular retraction 2x10 Shoulder rolls (attempted, increase in pain) Seated chin tucks (attempted, increase in pain) Manual Therapy: (unable to tolerate any) STM upper traps/cervical paraspinals Positional release upper traps MTPR upper traps SO release Modalities MHP Seated L shoulder,  upper trap, 10 mins Self Care: Discussion of course of PT, need for decreased pain levels to participate in PT, possible need to return to MD for further work up  [facility] Adult PT Treatment:                                                DATE: 01/18/2024 Therapeutic Exercise: Seated L upper trap stretch x 30  Supine chin tuck x 5 - 5 hold Scapular retraction - attempted Median nerve glide - attempted  PATIENT EDUCATION:  Education details: eval findings, NDI, HEP, POC Person educated: Patient Education method: Explanation, Demonstration, and Handouts Education comprehension: verbalized understanding and returned demonstration  HOME EXERCISE PROGRAM: Access Code: [email or web address] URL: [email or web address] Date: 01/18/2024 Prepared by: Alm Kingdom  Exercises - Seated Upper Trapezius Stretch (Mirrored)  - 1 x daily - 7 x weekly - 2-3 sets - 30 sec hold - Supine Chin Tuck  - 1 x daily - 7 x weekly - 2 sets - 10 reps - 5 sec hold  ASSESSMENT:  CLINICAL IMPRESSION: Patient presents to PT reporting continued 10/10 pain in his neck and radicular symptoms down the LUE. Attempted exercises including gentle stretching and strengthening, manual techniques, and moist heat but patient was unable to tolerate. Gentle manual elicited increased pain and twitch response and weight of hot pack increased his pain as well. Will attempt further PT, but may need to return to MD for further evaluation to decrease pain levels so that he can participate in PT.  Eval: Patient is a 58 y.o. M who was seen today for physical therapy evaluation and treatment for acute on chronic neck pain with referral into L UE. Physical findings are consistent with MD impression as pt demonstrates decreased cervical ROM, palpable pain in cervical spine with referral down L UE, and decreased functional endurance of postural muscles. NDI score shows severe disability in performance of home ADLs and community activities. Pt  would benefit from skilled PT services working on DNF and periscapular strengthening while addressing radicular symptoms down L UE.    OBJECTIVE IMPAIRMENTS: decreased activity tolerance, decreased endurance, decreased mobility, decreased ROM, decreased strength, impaired UE functional use, postural dysfunction, and pain.   ACTIVITY LIMITATIONS: carrying, lifting, sitting, standing, transfers, bed mobility, bathing, toileting, dressing, and reach over head  PARTICIPATION LIMITATIONS: meal prep, cleaning, laundry, driving, shopping, community activity, occupation, and yard work  PERSONAL FACTORS: Time since onset of injury/illness/exacerbation are also affecting patient's functional outcome.   REHAB POTENTIAL: Good  CLINICAL DECISION MAKING: Evolving/moderate complexity  EVALUATION COMPLEXITY: Moderate   GOALS: Goals reviewed with patient? No  SHORT TERM GOALS: Target date: 02/08/2024   Pt will be compliant and knowledgeable with initial HEP for improved comfort and carryover Baseline: initial HEP given  Goal status: INITIAL  2.  Pt will self report neck and L UE pain no greater than 7/10 for improved comfort and functional ability Baseline: 10/10 at worst Goal status: INITIAL   LONG TERM GOALS: Target date: 03/14/2024   Pt will decrease NDI disability score to no  greater than 70% (35/50) as proxy for functional improvement Baseline: 84% disability (42/50) Goal status: INITIAL   2.  Pt will self report neck and L UE pain no greater than 5/10 for improved comfort and functional ability Baseline: 10/10 at worst Goal status: INITIAL   3.  Pt will improve L sided cervical rotation AROM to no less than 60 degrees for improved functional ability and decreased pain Baseline: 42 degrees Goal status: INITIAL  4.  Pt will improve L hand grip strength to at least 60lbs for improved functional ability and decreased limitation Baseline: 25lbs Goal status: INITIAL    PLAN:  PT  FREQUENCY: 1-2x/week  PT DURATION: 8 weeks  PLANNED INTERVENTIONS: 97164- PT Re-evaluation, 97110-Therapeutic exercises, 97530- Therapeutic activity, [id number]- Neuromuscular re-education, 97535- Self Care, 02859- Manual therapy, G0283- Electrical stimulation (unattended), [id number]- Electrical stimulation (manual), [id number]- Vasopneumatic device, [id number]- Traction (mechanical), [id number] (1-2 muscles), 20561 (3+ muscles)- Dry Needling, and Patient/Family education  PLAN FOR NEXT SESSION: assess HEP response, manual therapy, DNF/periscapular strength, nerve glides, mechanical traction if unable to find alleviation   Shanda Code 02/02/2024, 4:16 PM

## 2024-02-04 ENCOUNTER — Ambulatory Visit: Payer: MEDICAID | Admitting: Physical Therapy

## 2024-02-08 ENCOUNTER — Ambulatory Visit: Payer: MEDICAID

## 2024-02-08 DIAGNOSIS — R293 Abnormal posture: Secondary | ICD-10-CM

## 2024-02-08 DIAGNOSIS — M542 Cervicalgia: Secondary | ICD-10-CM | POA: Diagnosis not present

## 2024-02-08 DIAGNOSIS — M6281 Muscle weakness (generalized): Secondary | ICD-10-CM

## 2024-02-08 NOTE — Therapy (Addendum)
 " OUTPATIENT PHYSICAL THERAPY CERVICAL EVALUATION PHYSICAL THERAPY UNPLANNED DISCHARGE SUMMARY   Visits from Start of Care: 3  Current functional level related to goals / functional outcomes: Current status unknown   Remaining deficits: Current status unknown   Education / Equipment: Pt has not returned since visit listed below  Patient goals were not assessed. Patient is being discharged due to not returning since the last visit.  (the note below was addended to include the above D/C summary on 04/27/24)   Patient Name: Christopher Hughes MRN: [id number] DOB:1966-01-02, 58 y.o., male Today's Date: 02/08/2024  END OF SESSION:    Past Medical History:  Diagnosis Date   Carpal tunnel syndrome    Herniated cervical disc    No past surgical history on file. Patient Active Problem List   Diagnosis Date Noted   MDD (major depressive disorder), recurrent episode, severe (HCC) 11/14/2021   Cocaine use disorder (HCC) 11/14/2021   Suicidal ideations 11/14/2021   Opioid use disorder 11/07/2021    PCP: Austin Mutton, MD  REFERRING PROVIDER: Georgina Ozell LABOR, MD  REFERRING DIAG: [phone] (ICD-10-CM) - Radiculopathy, cervical region   THERAPY DIAG:  No diagnosis found.  Rationale for Evaluation and Treatment: Rehabilitation  ONSET DATE: May 2025  SUBJECTIVE:                                                                                                                                                                                                         SUBJECTIVE STATEMENT: Severe 10/10 pain today. Reports feeling extremely painful last weeks session and having to skip Thursdays appt because of it. Reports as still being extremely painful and stiff after last visit.     Pt presents to PT with roughly 5 month hx of L sided cervical pain that radiates into L UE. No trauma or MOI, he woke up and his spouse that he was having a CVA because he could not move his L UE. Was denied a  cervical epidural injection due to not having 6 weeks of PT. Note increasing hand clumsiness, even into R hand. Denies bowel or bladder changes or saddle anesthesia. Also denies gait disturbances, does feel significant N/T down anterior and lateral L UE.   Hand dominance: Right  PERTINENT HISTORY:  See PMH  PAIN:  10C Are you having pain?  Yes: NPRS scale: 10/10 Worst: 10/10 Pain location: L cervical paraspinals, L upper trap, L UE Pain description: sharp, N/T  Aggravating factors: driving, turning head Relieving factors: none   PRECAUTIONS: None  RED FLAGS: None  WEIGHT BEARING RESTRICTIONS: No  FALLS:  Has patient fallen in last 6 months? No  LIVING ENVIRONMENT: Lives with: lives with their spouse Lives in: House/apartment  OCCUPATION: Not currently working  PLOF: Independent  PATIENT GOALS: decrease neck pain, improve L shoulder motion and overall independence   NEXT MD VISIT: 02/10/2024  OBJECTIVE:  Note: Objective measures were completed at Evaluation unless otherwise noted.  DIAGNOSTIC FINDINGS:  See imaging   PATIENT SURVEYS:  NDI:  NECK DISABILITY INDEX  Date: 01/18/2024 Score  Pain intensity 4 = The pain is very severe at the moment  2. Personal care (washing, dressing, etc.) 1 =  I can look after myself normally but it causes extra pain  3. Lifting 5 = I cannot lift or carry anything   4. Reading 5 =  I cannot read at all  5. Headaches 5 = I have headaches almost all the time  6. Concentration 4 =  I have a great deal of difficulty in concentrating when I want to  7. Work 5 =  I can't do any work at all  8. Driving 5 = I can't drive my car at all  9. Sleeping 4 = My sleep is greatly disturbed (3-5 hrs sleepless)   10. Recreation 4 =  I can hardly do any recreation activities because of pain in my neck  Total 42/50   Minimum Detectable Change (90% confidence): 5 points or 10% points  COGNITION: Overall cognitive status: Within functional  limits for tasks assessed  SENSATION: Light touch: Impaired - L UE C5-7 dermatomes  POSTURE: rounded shoulders, forward head, and L UE guarded against body  PALPATION: Significant TTP to L upper trap, L cervical paraspinals   CERVICAL ROM:  02/08/24: unable to rotate head more than 10 degrees L  Active ROM A/PROM (deg) eval  Flexion   Extension   Right lateral flexion   Left lateral flexion   Right rotation 60  Left rotation 43   (Blank rows = not tested)  UPPER EXTREMITY ROM:  Active ROM Right eval Left eval  Shoulder flexion 160 30  Shoulder extension    Shoulder abduction    Shoulder adduction    Shoulder extension    Shoulder internal rotation    Shoulder external rotation    Elbow flexion    Elbow extension    Wrist flexion    Wrist extension    Wrist ulnar deviation    Wrist radial deviation    Wrist pronation    Wrist supination     (Blank rows = not tested)  UPPER EXTREMITY MMT:  MMT Right eval Left eval  Shoulder flexion    Shoulder extension    Shoulder abduction    Shoulder adduction    Shoulder extension    Shoulder internal rotation    Shoulder external rotation    Middle trapezius    Lower trapezius    Elbow flexion    Elbow extension    Wrist flexion    Wrist extension    Wrist ulnar deviation    Wrist radial deviation    Wrist pronation    Wrist supination    Grip strength 100lb 25lb   (Blank rows = not tested)  CERVICAL SPECIAL TESTS:  Upper limb tension test (ULTT): Positive  Cervical Myelopathy Cluster (2/5 positive; 02/08/24): +greater than 10 years old +gait deviation -hoffmans -babinski -inverted supinator  FUNCTIONAL TESTS:  Deep Cervical Flexor Endurance: 5 seconds  TREATMENT: 02/08/24 NME: *Patient required extra time  for exercises due to increased monitoring, reassessment, and rest due to low activity tolerance and/or high irritability of symptoms* *Patient required extra time for exercises due to  additional reeducation on pain science, optimal loading, prognosis, and relevant tissues/anatomy*  (Patient educated on not pushing past pain, but still inadvertently pushing past pain with any of the movements) Thoracic extension x8x3s, x6x3s (DC) Seated retraction x3x3s (DC) Seated scapular retraction x8 (DC) Seated shrug x8x3s (DC) Seated cervical side bend x8x3s (DC) Seated cervical rotation x8x3s (DC) Seated trunk rotation x8x3s (DC)    [facility] Adult PT Treatment:                                                DATE: 01/18/2024 Therapeutic Exercise: Seated L upper trap stretch x 30  Supine chin tuck x 5 - 5 hold Scapular retraction - attempted Median nerve glide - attempted  PATIENT EDUCATION:  Education details: eval findings, NDI, HEP, POC Person educated: Patient Education method: Explanation, Demonstration, and Handouts Education comprehension: verbalized understanding and returned demonstration  HOME EXERCISE PROGRAM: Access Code: [email or web address] URL: [email or web address] Date: 01/18/2024 Prepared by: Alm Kingdom  Exercises - Seated Upper Trapezius Stretch (Mirrored)  - 1 x daily - 7 x weekly - 2-3 sets - 30 sec hold - Supine Chin Tuck  - 1 x daily - 7 x weekly - 2 sets - 10 reps - 5 sec hold   ASSESSMENT:  CLINICAL IMPRESSION: Patient did not tolerate treatment with severe increases in pain with minimal movement despite regressing exercises, decreasing ROM significantly, and working on distal movement (thoracic spine movements). Patient was also educated on not pushing past pain with all movements but was still inadvertently pushing through the pain. As a result, patient is deemed unfit for physical therapy at the moment due to severe pain and activity intolerance. Therapist suggests additional imaging of spine and/or pain medications to decrease pain severity before continuing with physical therapy.    EVAL: Patient is a 58 y.o. M who was seen today for  physical therapy evaluation and treatment for acute on chronic neck pain with referral into L UE. Physical findings are consistent with MD impression as pt demonstrates decreased cervical ROM, palpable pain in cervical spine with referral down L UE, and decreased functional endurance of postural muscles. NDI score shows severe disability in performance of home ADLs and community activities. Pt would benefit from skilled PT services working on DNF and periscapular strengthening while addressing radicular symptoms down L UE.    OBJECTIVE IMPAIRMENTS: decreased activity tolerance, decreased endurance, decreased mobility, decreased ROM, decreased strength, impaired UE functional use, postural dysfunction, and pain.   ACTIVITY LIMITATIONS: carrying, lifting, sitting, standing, transfers, bed mobility, bathing, toileting, dressing, and reach over head  PARTICIPATION LIMITATIONS: meal prep, cleaning, laundry, driving, shopping, community activity, occupation, and yard work  PERSONAL FACTORS: Time since onset of injury/illness/exacerbation are also affecting patient's functional outcome.   REHAB POTENTIAL: Good  CLINICAL DECISION MAKING: Evolving/moderate complexity  EVALUATION COMPLEXITY: Moderate   GOALS: Goals reviewed with patient? No  SHORT TERM GOALS: Target date: 02/08/2024   Pt will be compliant and knowledgeable with initial HEP for improved comfort and carryover Baseline: initial HEP given  Goal status: INITIAL  2.  Pt will self report neck and L UE pain no greater than 7/10 for improved comfort and functional ability Baseline: 10/10 at  worst Goal status: INITIAL   LONG TERM GOALS: Target date: 03/14/2024   Pt will decrease NDI disability score to no greater than 70% (35/50) as proxy for functional improvement Baseline: 84% disability (42/50) Goal status: INITIAL   2.  Pt will self report neck and L UE pain no greater than 5/10 for improved comfort and functional  ability Baseline: 10/10 at worst Goal status: INITIAL   3.  Pt will improve L sided cervical rotation AROM to no less than 60 degrees for improved functional ability and decreased pain Baseline: 42 degrees Goal status: INITIAL  4.  Pt will improve L hand grip strength to at least 60lbs for improved functional ability and decreased limitation Baseline: 25lbs Goal status: INITIAL    PLAN:  PT FREQUENCY: 1-2x/week  PT DURATION: 8 weeks  PLANNED INTERVENTIONS: 97164- PT Re-evaluation, 97110-Therapeutic exercises, 97530- Therapeutic activity, [id number]- Neuromuscular re-education, 97535- Self Care, 02859- Manual therapy, G0283- Electrical stimulation (unattended), [id number]- Electrical stimulation (manual), [id number]- Vasopneumatic device, [id number]- Traction (mechanical), [id number] (1-2 muscles), 20561 (3+ muscles)- Dry Needling, and Patient/Family education  PLAN FOR NEXT SESSION: reassess tolerated movements for NME, review option for discharge with patient if still as irritable as past session.   Washington Greener  [place], PT 02/08/2024, 4:22 PM      "

## 2024-02-10 ENCOUNTER — Ambulatory Visit: Payer: MEDICAID | Admitting: Orthopedic Surgery

## 2024-02-10 DIAGNOSIS — M5412 Radiculopathy, cervical region: Secondary | ICD-10-CM

## 2024-02-10 MED ORDER — TRAMADOL HCL 50 MG PO TABS: 50.0000 mg | ORAL_TABLET | Freq: Four times a day (QID) | ORAL | 0 refills | Status: AC | PRN

## 2024-02-10 NOTE — Progress Notes (Signed)
 Orthopedic Spine Surgery Office Note   Assessment: Patient is a 58 y.o. male with cervical radiculopathy.  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  After covering this option, patient elected to proceed -Patient has tried: 6 weeks of a physician directed home exercise program, Tylenol , Mobic, oral steroids, intramuscular steroid injections, gabapentin , cervical ESI, PT -Patient will next be seen at date of surgery     The patient has cervical radiculopathy, which was initially treated with non-operative measures. The patient's symptoms failed to improve with conservative treatments and he has developed progressive weakness in the left upper extremity, so operative management was discussed in the form of C5-7 anterior cervical discectomy and fusion. The risks, including but not limited to pseudarthrosis, dysphagia, hematoma, airway compromise, recurrent laryngeal nerve injury, esophageal perforation, durotomy, spinal cord injury, nerve root injury, persistent pain, adjacent segment disease, infection, bleeding, hardware failure/malposition, vascular injury, heart attack, death, stroke, dvt/pe, and need for additional procedures were discussed with the patient. I discussed the fact that his risks would be higher given his active nicotine  use. He stated he has cut back but is still using nicotine . I encouraged him to continue to cut back and to work towards cessation. The benefit of surgery would be improvement in the patient's radiating arm pain. Explained that the patient may not get full relief of his pain with this surgery, especially any neck pain. The alternatives to surgical management would be continued monitoring, physical therapy, over-the-counter pain medications, injections, traction, and  activity modification. All the patient's questions were answered to his satisfaction. After this discussion, the patient expressed understanding and elected to proceed with surgical intervention.      ___________________________________________________________________________     History:   Patient is a 58 y.o. male who presents today for follow up on his cervical spine.  Patient has had over 6 months of neck pain that radiates into the left upper extremity.  Feels going into the lateral shoulder, lateral arm, dorsal forearm, and into the hand.  He said the pain has gotten worse since he was last seen in the office.  He rates the pain as a 10 out of 10.  He has tried multiple conservative treatments but has not noticed relief with any of those treatments.  Besides worsening of his pain, he has not noticed any changes in his symptoms.     Physical Exam:   General: no acute distress, appears stated age Neurologic: alert, answering questions appropriately, following commands Respiratory: unlabored breathing on room air, symmetric chest rise Psychiatric: appropriate affect, normal cadence to speech     MSK (spine):   -Strength exam                                                   Left                  Right Grip strength                4/5                  5/5 Interosseus  4/5                  5/5 Wrist extension            3/5                  5/5 Wrist flexion                 4/5                  5/5 Elbow flexion                3/5                  5/5 Deltoid                          4/5                  5/5   Some of his left upper extremity weakness seems related decreased effort due to pain  -Sensory exam                         Sensation intact to light touch in C5-T1 nerve distributions of bilateral upper extremities   Imaging: XRs of the cervical spine from 12/16/2023 were previously independently reviewed and interpreted, showing disc at loss at C3/4, C5/6.   No other significant degenerative changes seen.  No evidence of instability on flexion/extension views.  No fracture or dislocation seen.   MRI of the cervical spine from 08/18/2023 was previously independently reviewed and interpreted, showing left-sided foraminal stenosis at C3/4.  Bilateral foraminal stenosis at C5/6.  Left-sided foraminal stenosis at C6/7.  No other significant stenosis seen.  No T2 cord signal change seen.  On the mid sagittal cuts, there is CSF seen both ventrally and dorsally around the cord.     Patient name: Christopher Hughes Patient MRN: [id number] Date of visit: 02/10/24

## 2024-02-11 ENCOUNTER — Ambulatory Visit: Payer: MEDICAID

## 2024-02-18 ENCOUNTER — Ambulatory Visit: Payer: MEDICAID

## 2024-03-11 NOTE — Progress Notes (Signed)
 Surgical Instructions   Your procedure is scheduled on Tuesday March 15, 2024. Report to [facility] Main Entrance A at 5:300 A.M., then check in with the Admitting office. Any questions or running late day of surgery: call [phone]  Questions prior to your surgery date: call [phone], Monday-Friday, 8am-4pm. If you experience any cold or flu symptoms such as cough, fever, chills, shortness of breath, etc. between now and your scheduled surgery, please notify us  at the above number.     Remember:  Do not eat after midnight the night before your surgery  You may drink clear liquids until 4:30 the morning of your surgery.   Clear liquids allowed are: Water, Non-Citrus Juices (without pulp), Carbonated Beverages, Clear Tea (no milk, honey, etc.), Black Coffee Only (NO MILK, CREAM OR POWDERED CREAMER of any kind), and Gatorade.  Patient Instructions  The night before surgery:  No food after midnight. ONLY clear liquids after midnight  The day of surgery (if you do NOT have diabetes):  Drink ONE (1) Pre-Surgery Clear Ensure by 4:30 the morning of surgery. Drink in one sitting. Do not sip.  This drink was given to you during your hospital  pre-op appointment visit.  Nothing else to drink after completing the  Pre-Surgery Clear Ensure.         If you have questions, please contact your surgeons office.   Take these medicines the morning of surgery with A SIP OF WATER  amoxicillin (AMOXIL)  gabapentin  (NEURONTIN )    May take these medicines IF NEEDED: traMADol -acetaminophen  (ULTRACET)    One week prior to surgery, STOP taking any Aspirin (unless otherwise instructed by your surgeon) Aleve , Naproxen , Ibuprofen , Motrin , Advil , Goody's, BC's, all herbal medications, fish oil, and non-prescription vitamins.  This includes your Omega XL.                       Do NOT Smoke (Tobacco/Vaping) for 24 hours prior to your procedure.  If you use a CPAP at night, you may bring  your mask/headgear for your overnight stay.   You will be asked to remove any contacts, glasses, piercing's, hearing aid's, dentures/partials prior to surgery. Please bring cases for these items if needed.    Patients discharged the day of surgery will not be allowed to drive home, and someone needs to stay with them for 24 hours.  SURGICAL WAITING ROOM VISITATION Patients may have no more than 2 support people in the waiting area - these visitors may rotate.   Pre-op nurse will coordinate an appropriate time for 1 ADULT support person, who may not rotate, to accompany patient in pre-op.  Children under the age of 7 must have an adult with them who is not the patient and must remain in the main waiting area with an adult.  If the patient needs to stay at the hospital during part of their recovery, the visitor guidelines for inpatient rooms apply.  Please refer to the [facility] website for the visitor guidelines for any additional information.   If you received a COVID test during your pre-op visit  it is requested that you wear a mask when out in public, stay away from anyone that may not be feeling well and notify your surgeon if you develop symptoms. If you have been in contact with anyone that has tested positive in the last 10 days please notify you surgeon.      Pre-operative 4 CHG Bathing Instructions   You can play  a key role in reducing the risk of infection after surgery. Your skin needs to be as free of germs as possible. You can reduce the number of germs on your skin by washing with CHG (chlorhexidine gluconate) soap before surgery. CHG is an antiseptic soap that kills germs and continues to kill germs even after washing.   DO NOT use if you have an allergy to chlorhexidine/CHG or antibacterial soaps. If your skin becomes reddened or irritated, stop using the CHG and notify one of our RNs at [phone].   Please shower with the CHG soap starting 4 days before surgery using  the following schedule:     Please keep in mind the following:  You may shave your face at any point before/day of surgery.  Place clean sheets on your bed the day you start using CHG soap. Use a clean washcloth (not used since being washed) for each shower. DO NOT sleep with pets once you start using the CHG.   CHG Shower Instructions:  Wash your face and private area with normal soap. If you choose to wash your hair, wash first with your normal shampoo.  After you use shampoo/soap, rinse your hair and body thoroughly to remove shampoo/soap residue.  Turn the water OFF and apply  bottle of CHG soap to a CLEAN washcloth.  Apply CHG soap ONLY FROM YOUR NECK DOWN TO YOUR TOES (washing for 3-5 minutes)  DO NOT use CHG soap on face, private areas, open wounds, or sores.  Pay special attention to the area where your surgery is being performed.  If you are having back surgery, having someone wash your back for you may be helpful. Wait 2 minutes after CHG soap is applied, then you may rinse off the CHG soap.  Pat dry with a clean towel  Put on clean clothes/pajamas   If you choose to wear lotion, please use ONLY the CHG-compatible lotions that are listed below.  Additional instructions for the day of surgery:  If you choose, you may shower the morning of surgery with an antibacterial soap.  DO NOT APPLY any lotions, deodorants or cologne.   Do not bring valuables to the hospital. [facility] is not responsible for any belongings/valuables. Do not wear jewelry Put on clean/comfortable clothes.  Please brush your teeth.  Ask your nurse before applying any prescription medications to the skin.     CHG Compatible Lotions   Aveeno Moisturizing lotion  Cetaphil Moisturizing Cream  Cetaphil Moisturizing Lotion  Clairol Herbal Essence Moisturizing Lotion, Dry Skin  Clairol Herbal Essence Moisturizing Lotion, Extra Dry Skin  Clairol Herbal Essence Moisturizing Lotion, Normal Skin  Curel  Age Defying Therapeutic Moisturizing Lotion with Alpha Hydroxy  Curel Extreme Care Body Lotion  Curel Soothing Hands Moisturizing Hand Lotion  Curel Therapeutic Moisturizing Cream, Fragrance-Free  Curel Therapeutic Moisturizing Lotion, Fragrance-Free  Curel Therapeutic Moisturizing Lotion, Original Formula  Eucerin Daily Replenishing Lotion  Eucerin Dry Skin Therapy Plus Alpha Hydroxy Crme  Eucerin Dry Skin Therapy Plus Alpha Hydroxy Lotion  Eucerin Original Crme  Eucerin Original Lotion  Eucerin Plus Crme Eucerin Plus Lotion  Eucerin TriLipid Replenishing Lotion  Keri Anti-Bacterial Hand Lotion  Keri Deep Conditioning Original Lotion Dry Skin Formula Softly Scented  Keri Deep Conditioning Original Lotion, Fragrance Free Sensitive Skin Formula  Keri Lotion Fast Absorbing Fragrance Free Sensitive Skin Formula  Keri Lotion Fast Absorbing Softly Scented Dry Skin Formula  Keri Original Lotion  Keri Skin Renewal Lotion Keri Silky Smooth Lotion  [place]  Silky Smooth Sensitive Skin Lotion  Nivea Body Creamy Conditioning Oil  Nivea Body Extra Enriched Lotion  Nivea Body Original Lotion  Nivea Body Sheer Moisturizing Lotion Nivea Crme  Nivea Skin Firming Lotion  NutraDerm 30 Skin Lotion  NutraDerm Skin Lotion  NutraDerm Therapeutic Skin Cream  NutraDerm Therapeutic Skin Lotion  ProShield Protective Hand Cream  Provon moisturizing lotion  Please read over the following fact sheets that you were given.

## 2024-03-14 ENCOUNTER — Inpatient Hospital Stay (HOSPITAL_COMMUNITY)
Admission: RE | Admit: 2024-03-14 | Discharge: 2024-03-14 | Disposition: A | Payer: MEDICAID | Source: Ambulatory Visit | Attending: Orthopedic Surgery

## 2024-03-14 ENCOUNTER — Encounter (HOSPITAL_COMMUNITY): Payer: Self-pay

## 2024-03-14 ENCOUNTER — Other Ambulatory Visit: Payer: Self-pay

## 2024-03-14 DIAGNOSIS — Z01812 Encounter for preprocedural laboratory examination: Secondary | ICD-10-CM | POA: Diagnosis present

## 2024-03-14 DIAGNOSIS — Z01818 Encounter for other preprocedural examination: Secondary | ICD-10-CM

## 2024-03-14 LAB — CBC
HCT: 48.7 % (ref 39.0–52.0)
Hemoglobin: 16.2 g/dL (ref 13.0–17.0)
MCH: 30.9 pg (ref 26.0–34.0)
MCHC: 33.3 g/dL (ref 30.0–36.0)
MCV: 92.9 fL (ref 80.0–100.0)
Platelets: 254 K/uL (ref 150–400)
RBC: 5.24 MIL/uL (ref 4.22–5.81)
RDW: 12.8 % (ref 11.5–15.5)
WBC: 9 K/uL (ref 4.0–10.5)
nRBC: 0 % (ref 0.0–0.2)

## 2024-03-14 LAB — BASIC METABOLIC PANEL WITH GFR
Anion gap: 8 (ref 5–15)
BUN: 16 mg/dL (ref 6–20)
CO2: 25 mmol/L (ref 22–32)
Calcium: 9.1 mg/dL (ref 8.9–10.3)
Chloride: 106 mmol/L (ref 98–111)
Creatinine, Ser: 1.24 mg/dL (ref 0.61–1.24)
GFR, Estimated: >60 mL/min (ref >60)
Glucose, Bld: 93 mg/dL (ref 70–99)
Potassium: 3.9 mmol/L (ref 3.5–5.1)
Sodium: 139 mmol/L (ref 135–145)

## 2024-03-14 LAB — SURGICAL PCR SCREEN
MRSA, PCR: NEGATIVE
Staphylococcus aureus: NEGATIVE

## 2024-03-14 NOTE — Progress Notes (Signed)
 [facility] - denies  PPM/ICD -  n/a   Chest x-ray - 08/18/23 EKG - 08/18/23 Stress Test - denies ECHO - denies Cardiac Cath - denies  Sleep Study - denies   Fasting Blood Sugar - N/A  Last dose of GLP1 agonist-  N/a   Blood Thinner Instructions: n/A Aspirin Instructions:N/A  ERAS Protcol - ERAS +ensure   COVID TEST-  N/A   Anesthesia review: pt reports having all of his teeth pulled the week before Thanksgiving. Pt finished amoxicillin for this at the beginning of this week. Pt reports his mouth has healed well, and denies signs of infection- he had stitches removed already. Pt only c/o dizziness sometimes when he stands up to fast/turns his head. Pt reports that Dr. Georgina is aware of this. Pt denies passing out, but has fallen d/t this before. Pt denies other symptoms. Pt reports having to catch his breath coming down the parking deck stairs today d/t being in a rush. Anesthesia APP notified while pt at PAT.   Patient denies shortness of breath, fever, cough and chest pain at PAT appointment   All instructions explained to the patient, with a verbal understanding of the material. Patient agrees to go over the instructions while at home for a better understanding. Patient also instructed to self quarantine after being tested for COVID-19. The opportunity to ask questions was provided.

## 2024-03-14 NOTE — Anesthesia Preprocedure Evaluation (Signed)
 Anesthesia Evaluation  Patient identified by MRN, date of birth, ID band Patient awake    Reviewed: Allergy & Precautions, H&P , NPO status , Patient's Chart, lab work & pertinent test results  Airway Mallampati: II  TM Distance: >3 FB Neck ROM: Full    Dental no notable dental hx. (+) Edentulous Upper, Edentulous Lower, Dental Advisory Given   Pulmonary neg pulmonary ROS, Current Smoker, former smoker   Pulmonary exam normal breath sounds clear to auscultation       Cardiovascular Exercise Tolerance: Good negative cardio ROS  Rhythm:Regular Rate:Normal     Neuro/Psych    Depression    negative neurological ROS  negative psych ROS   GI/Hepatic negative GI ROS, Neg liver ROS,,,  Endo/Other  negative endocrine ROS    Renal/GU negative Renal ROS  negative genitourinary   Musculoskeletal   Abdominal   Peds  Hematology negative hematology ROS (+)   Anesthesia Other Findings   Reproductive/Obstetrics negative OB ROS                              Anesthesia Physical Anesthesia Plan  ASA: 2  Anesthesia Plan: General   Post-op Pain Management: Tylenol  PO (pre-op)*   Induction: Intravenous  PONV Risk Score and Plan: 3 and Ondansetron , Dexamethasone  and Midazolam   Airway Management Planned: Oral ETT  Additional Equipment:   Intra-op Plan:   Post-operative Plan: Extubation in OR  Informed Consent: I have reviewed the patients History and Physical, chart, labs and discussed the procedure including the risks, benefits and alternatives for the proposed anesthesia with the patient or authorized representative who has indicated his/her understanding and acceptance.     Dental advisory given  Plan Discussed with: CRNA  Anesthesia Plan Comments:          Anesthesia Quick Evaluation

## 2024-03-15 ENCOUNTER — Ambulatory Visit (HOSPITAL_BASED_OUTPATIENT_CLINIC_OR_DEPARTMENT_OTHER): Payer: MEDICAID | Admitting: Anesthesiology

## 2024-03-15 ENCOUNTER — Ambulatory Visit (HOSPITAL_COMMUNITY): Payer: MEDICAID

## 2024-03-15 ENCOUNTER — Other Ambulatory Visit (HOSPITAL_COMMUNITY): Payer: Self-pay

## 2024-03-15 ENCOUNTER — Ambulatory Visit (HOSPITAL_COMMUNITY)
Admission: RE | Admit: 2024-03-15 | Discharge: 2024-03-15 | Disposition: A | Payer: MEDICAID | Attending: Orthopedic Surgery | Admitting: Orthopedic Surgery

## 2024-03-15 ENCOUNTER — Encounter (HOSPITAL_COMMUNITY): Payer: Self-pay | Admitting: Orthopedic Surgery

## 2024-03-15 ENCOUNTER — Ambulatory Visit (HOSPITAL_COMMUNITY): Payer: MEDICAID | Admitting: Physician Assistant

## 2024-03-15 ENCOUNTER — Encounter (HOSPITAL_COMMUNITY): Admission: RE | Disposition: A | Payer: Self-pay | Source: Home / Self Care | Attending: Orthopedic Surgery

## 2024-03-15 DIAGNOSIS — M2578 Osteophyte, vertebrae: Secondary | ICD-10-CM | POA: Diagnosis not present

## 2024-03-15 DIAGNOSIS — M5412 Radiculopathy, cervical region: Secondary | ICD-10-CM

## 2024-03-15 DIAGNOSIS — M50123 Cervical disc disorder at C6-C7 level with radiculopathy: Secondary | ICD-10-CM

## 2024-03-15 DIAGNOSIS — Z01818 Encounter for other preprocedural examination: Secondary | ICD-10-CM

## 2024-03-15 DIAGNOSIS — M50122 Cervical disc disorder at C5-C6 level with radiculopathy: Secondary | ICD-10-CM | POA: Diagnosis not present

## 2024-03-15 DIAGNOSIS — Z87891 Personal history of nicotine dependence: Secondary | ICD-10-CM | POA: Diagnosis not present

## 2024-03-15 HISTORY — PX: ANTERIOR CERVICAL DECOMP/DISCECTOMY FUSION: SHX1161

## 2024-03-15 SURGERY — ANTERIOR CERVICAL DECOMPRESSION/DISCECTOMY FUSION 2 LEVELS
Anesthesia: General

## 2024-03-15 MED ORDER — DEXMEDETOMIDINE HCL IN NACL 80 MCG/20ML IV SOLN
INTRAVENOUS | Status: DC | PRN
  Administered 2024-03-15 (×7): 4 ug via INTRAVENOUS

## 2024-03-15 MED ORDER — MIDAZOLAM HCL (PF) 2 MG/2ML IJ SOLN
INTRAMUSCULAR | Status: DC | PRN
  Administered 2024-03-15: 07:00:00 2 mg via INTRAVENOUS

## 2024-03-15 MED ORDER — ROCURONIUM BROMIDE 10 MG/ML (PF) SYRINGE
PREFILLED_SYRINGE | INTRAVENOUS | Status: DC | PRN
  Administered 2024-03-15 (×7): 10 mg via INTRAVENOUS
  Administered 2024-03-15: 08:00:00 50 mg via INTRAVENOUS
  Administered 2024-03-15: 09:00:00 10 mg via INTRAVENOUS

## 2024-03-15 MED ORDER — ORAL CARE MOUTH RINSE: 15.0000 mL | Freq: Once | OROMUCOSAL | Status: AC

## 2024-03-15 MED ORDER — HYDROMORPHONE HCL 1 MG/ML IJ SOLN
INTRAMUSCULAR | Status: AC
  Filled 2024-03-15: qty 0.5

## 2024-03-15 MED ORDER — HYDROMORPHONE HCL 1 MG/ML IJ SOLN
INTRAMUSCULAR | Status: DC | PRN
  Administered 2024-03-15: 08:00:00 .5 mg via INTRAVENOUS
  Administered 2024-03-15 (×2): .25 mg via INTRAVENOUS

## 2024-03-15 MED ORDER — POLYETHYLENE GLYCOL 3350 17 GM/SCOOP PO POWD
17.0000 g | Freq: Every day | ORAL | 0 refills | Status: AC
  Filled 2024-03-15: qty 238, 14d supply, fill #0

## 2024-03-15 MED ORDER — ONDANSETRON HCL 4 MG/2ML IJ SOLN
INTRAMUSCULAR | Status: AC
  Filled 2024-03-15: qty 2

## 2024-03-15 MED ORDER — LACTATED RINGERS IV SOLN: INTRAVENOUS | Status: DC

## 2024-03-15 MED ORDER — HYDROMORPHONE HCL 1 MG/ML IJ SOLN
0.2500 mg | INTRAMUSCULAR | Status: DC | PRN
  Administered 2024-03-15 (×2): 0.5 mg via INTRAVENOUS

## 2024-03-15 MED ORDER — LIDOCAINE 2% (20 MG/ML) 5 ML SYRINGE
INTRAMUSCULAR | Status: DC | PRN
  Administered 2024-03-15: 08:00:00 100 mg via INTRAVENOUS

## 2024-03-15 MED ORDER — TRANEXAMIC ACID-NACL 1000-0.7 MG/100ML-% IV SOLN
1000.0000 mg | INTRAVENOUS | Status: AC
  Administered 2024-03-15: 08:00:00 1000 mg via INTRAVENOUS
  Filled 2024-03-15: qty 100

## 2024-03-15 MED ORDER — ROCURONIUM BROMIDE 10 MG/ML (PF) SYRINGE
PREFILLED_SYRINGE | INTRAVENOUS | Status: AC
  Filled 2024-03-15: qty 10

## 2024-03-15 MED ORDER — CHLORHEXIDINE GLUCONATE 0.12 % MT SOLN
15.0000 mL | Freq: Once | OROMUCOSAL | Status: AC
  Administered 2024-03-15: 06:00:00 15 mL via OROMUCOSAL
  Filled 2024-03-15: qty 15

## 2024-03-15 MED ORDER — SENNA 8.6 MG PO TABS
1.0000 | ORAL_TABLET | Freq: Two times a day (BID) | ORAL | 0 refills | Status: AC
  Filled 2024-03-15: qty 28, 14d supply, fill #0

## 2024-03-15 MED ORDER — DEXAMETHASONE SOD PHOSPHATE PF 10 MG/ML IJ SOLN
10.0000 mg | Freq: Once | INTRAMUSCULAR | Status: AC
  Administered 2024-03-15: 08:00:00 10 mg via INTRAVENOUS

## 2024-03-15 MED ORDER — SUGAMMADEX SODIUM 200 MG/2ML IV SOLN
INTRAVENOUS | Status: AC
  Filled 2024-03-15: qty 2

## 2024-03-15 MED ORDER — CEFAZOLIN SODIUM 1 G IJ SOLR
INTRAMUSCULAR | Status: AC
  Filled 2024-03-15: qty 20

## 2024-03-15 MED ORDER — 0.9 % SODIUM CHLORIDE (POUR BTL) OPTIME
TOPICAL | Status: DC | PRN
  Administered 2024-03-15 (×2): 1000 mL

## 2024-03-15 MED ORDER — SUGAMMADEX SODIUM 200 MG/2ML IV SOLN
INTRAVENOUS | Status: DC | PRN
  Administered 2024-03-15: 13:00:00 200 mg via INTRAVENOUS

## 2024-03-15 MED ORDER — OXYCODONE HCL 5 MG PO TABS
2.5000 mg | ORAL_TABLET | ORAL | 0 refills | Status: DC | PRN
  Filled 2024-03-15: qty 30, 5d supply, fill #0

## 2024-03-15 MED ORDER — ACETAMINOPHEN 500 MG PO TABS
1000.0000 mg | ORAL_TABLET | Freq: Once | ORAL | Status: AC
  Administered 2024-03-15: 06:00:00 1000 mg via ORAL
  Filled 2024-03-15: qty 2

## 2024-03-15 MED ORDER — CEFAZOLIN SODIUM-DEXTROSE 2-4 GM/100ML-% IV SOLN
2.0000 g | INTRAVENOUS | Status: AC
  Administered 2024-03-15 (×2): 2 g via INTRAVENOUS
  Filled 2024-03-15: qty 100

## 2024-03-15 MED ORDER — METHOCARBAMOL 500 MG PO TABS
500.0000 mg | ORAL_TABLET | Freq: Four times a day (QID) | ORAL | 0 refills | Status: AC
  Filled 2024-03-15: qty 40, 10d supply, fill #0

## 2024-03-15 MED ORDER — HYDROMORPHONE HCL 1 MG/ML IJ SOLN
INTRAMUSCULAR | Status: AC
  Filled 2024-03-15: qty 1

## 2024-03-15 MED ORDER — ACETAMINOPHEN 500 MG PO TABS
1000.0000 mg | ORAL_TABLET | Freq: Three times a day (TID) | ORAL | 0 refills | Status: DC
  Filled 2024-03-15: qty 84, 14d supply, fill #0

## 2024-03-15 MED ORDER — MIDAZOLAM HCL 2 MG/2ML IJ SOLN
INTRAMUSCULAR | Status: AC
  Filled 2024-03-15: qty 2

## 2024-03-15 MED ORDER — HEMOSTATIC AGENTS (NO CHARGE) OPTIME
TOPICAL | Status: DC | PRN
  Administered 2024-03-15: 09:00:00 1 via TOPICAL

## 2024-03-15 MED ORDER — LIDOCAINE 2% (20 MG/ML) 5 ML SYRINGE
INTRAMUSCULAR | Status: AC
  Filled 2024-03-15: qty 5

## 2024-03-15 MED ORDER — PHENYLEPHRINE HCL-NACL 20-0.9 MG/250ML-% IV SOLN
INTRAVENOUS | Status: DC | PRN
  Administered 2024-03-15: 08:00:00 20 ug/min via INTRAVENOUS

## 2024-03-15 MED ORDER — PROPOFOL 10 MG/ML IV BOLUS
INTRAVENOUS | Status: AC
  Filled 2024-03-15: qty 20

## 2024-03-15 MED ORDER — SODIUM CHLORIDE (PF) 0.9 % IJ SOLN
INTRAMUSCULAR | Status: AC
  Filled 2024-03-15: qty 10

## 2024-03-15 MED ORDER — ONDANSETRON HCL 4 MG/2ML IJ SOLN
INTRAMUSCULAR | Status: DC | PRN
  Administered 2024-03-15: 12:00:00 4 mg via INTRAVENOUS

## 2024-03-15 MED ORDER — PROPOFOL 10 MG/ML IV BOLUS
INTRAVENOUS | Status: DC | PRN
  Administered 2024-03-15: 13:00:00 100 mg via INTRAVENOUS
  Administered 2024-03-15: 08:00:00 150 mg via INTRAVENOUS

## 2024-03-15 MED ORDER — POVIDONE-IODINE 10 % EX SWAB
2.0000 | Freq: Once | CUTANEOUS | Status: AC
  Administered 2024-03-15: 06:00:00 2 via TOPICAL

## 2024-03-15 SURGICAL SUPPLY — 62 items
ALCOHOL 70% 16 OZ (MISCELLANEOUS) ×1 IMPLANT
ALLOGRAFT LORDOTIC CC 7X11X14 (Bone Implant) IMPLANT
ALLOGRAFT TRIAD LORDOTIC CC (Bone Implant) IMPLANT
BAG COUNTER SPONGE SURGICOUNT (BAG) ×1 IMPLANT
BAND RUBBER 3X1/6 STRL (MISCELLANEOUS) ×2 IMPLANT
BLADE CLIPPER SURG (BLADE) IMPLANT
BUR MATCHSTICK NEURO 3.0 LAGG (BURR) ×1 IMPLANT
CABLE BIPOLOR RESECTION CORD (MISCELLANEOUS) ×1 IMPLANT
CANISTER SUCTION 3000ML PPV (SUCTIONS) ×1 IMPLANT
CLSR STERI-STRIP ANTIMIC 1/2X4 (GAUZE/BANDAGES/DRESSINGS) ×1 IMPLANT
COVER MAYO STAND STRL (DRAPES) ×2 IMPLANT
COVER SURGICAL LIGHT HANDLE (MISCELLANEOUS) ×2 IMPLANT
DERMABOND ADVANCED .7 DNX12 (GAUZE/BANDAGES/DRESSINGS) IMPLANT
DRAPE C-ARM 42X72 X-RAY (DRAPES) ×1 IMPLANT
DRAPE LAPAROTOMY 100X72X124 (DRAPES) ×1 IMPLANT
DRAPE MICROSCOPE LEICA (MISCELLANEOUS) ×1 IMPLANT
DRAPE MICROSCOPE NONGLARE (MISCELLANEOUS) ×1 IMPLANT
DRAPE POUCH INSTRU U-SHP 10X18 (DRAPES) ×1 IMPLANT
DRAPE SURG 17X11 SM STRL (DRAPES) ×4 IMPLANT
DRAPE SURG 17X23 STRL (DRAPES) ×1 IMPLANT
DRAPE U-SHAPE 47X51 STRL (DRAPES) ×1 IMPLANT
DRAPE UTILITY XL STRL (DRAPES) ×1 IMPLANT
DRSG OPSITE POSTOP 3X4 (GAUZE/BANDAGES/DRESSINGS) ×1 IMPLANT
DRSG TEGADERM 4X4.75 (GAUZE/BANDAGES/DRESSINGS) IMPLANT
DURAPREP 26ML APPLICATOR (WOUND CARE) ×1 IMPLANT
ELECT COATED BLADE 2.86 ST (ELECTRODE) ×1 IMPLANT
ELECTRODE BLADE INSULATED 4IN (ELECTROSURGICAL) ×1 IMPLANT
ELECTRODE REM PT RTRN 9FT ADLT (ELECTROSURGICAL) ×1 IMPLANT
GAUZE SPONGE 4X4 12PLY STRL (GAUZE/BANDAGES/DRESSINGS) IMPLANT
GLOVE BIO SURGEON STRL SZ7.5 (GLOVE) ×2 IMPLANT
GLOVE INDICATOR 7.5 STRL GRN (GLOVE) ×1 IMPLANT
GOWN STRL REUS W/ TWL LRG LVL3 (GOWN DISPOSABLE) ×1 IMPLANT
GOWN STRL SURGICAL XL XLNG (GOWN DISPOSABLE) ×1 IMPLANT
KIT BASIN OR (CUSTOM PROCEDURE TRAY) ×1 IMPLANT
KIT TURNOVER KIT B (KITS) ×1 IMPLANT
NDL HYPO 22X1.5 SAFETY MO (MISCELLANEOUS) ×1 IMPLANT
NDL SPNL 18GX3.5 QUINCKE PK (NEEDLE) ×1 IMPLANT
NEEDLE HYPO 22X1.5 SAFETY MO (MISCELLANEOUS) ×1 IMPLANT
NEEDLE SPNL 18GX3.5 QUINCKE PK (NEEDLE) ×1 IMPLANT
PACK ORTHO CERVICAL (CUSTOM PROCEDURE TRAY) ×1 IMPLANT
PACK UNIVERSAL I (CUSTOM PROCEDURE TRAY) ×1 IMPLANT
PAD ARMBOARD POSITIONER FOAM (MISCELLANEOUS) ×2 IMPLANT
PENCIL BUTTON HOLSTER BLD 10FT (ELECTRODE) ×1 IMPLANT
PIN DISTRACTION MAXCESS-C 14 (PIN) IMPLANT
PLATE ACP 1.6X38 2LVL (Plate) IMPLANT
POSITIONER HEAD DONUT 9IN (MISCELLANEOUS) ×1 IMPLANT
RESTRAINT LIMB HOLDER UNIV (RESTRAINTS) ×1 IMPLANT
SCREW ACP VA SD 3.5X15 (Screw) IMPLANT
SOLN 0.9% NACL POUR BTL 1000ML (IV SOLUTION) ×1 IMPLANT
SOLN STERILE WATER BTL 1000 ML (IV SOLUTION) ×1 IMPLANT
SPONGE INTESTINAL PEANUT (DISPOSABLE) ×1 IMPLANT
SPONGE SURGIFOAM ABS GEL SZ50 (HEMOSTASIS) ×1 IMPLANT
SURGIFLO W/THROMBIN 8M KIT (HEMOSTASIS) IMPLANT
SUT BONE WAX W31G (SUTURE) ×1 IMPLANT
SUT MNCRL AB 3-0 PS2 27 (SUTURE) ×1 IMPLANT
SUT VIC AB 2-0 CT2 18 VCP726D (SUTURE) ×2 IMPLANT
SYR BULB IRRIG 60ML STRL (SYRINGE) ×1 IMPLANT
TAPE CLOTH 4X10 WHT NS (GAUZE/BANDAGES/DRESSINGS) ×1 IMPLANT
TOWEL GREEN STERILE (TOWEL DISPOSABLE) ×1 IMPLANT
TOWEL GREEN STERILE FF (TOWEL DISPOSABLE) ×1 IMPLANT
TRAY FOLEY W/BAG SLVR 16FR ST (SET/KITS/TRAYS/PACK) ×1 IMPLANT
TUBING FEATHERFLOW (TUBING) ×1 IMPLANT

## 2024-03-15 NOTE — Progress Notes (Signed)
 Orthopedic Surgery Post-operative Progress Note  Assessment: Patient is a 58 y.o. male who is recovering after C5-7 ACDF   Plan: -Operative plans complete -Drain: none -Out of bed as tolerated, no brace/collar needed -No bending/lifting/twisting greater than 10 pounds -Pain control -Regular diet -No antiplatelet or dvt chemoprophylaxis for 72 hours after surgery -Anticipate discharge to home from PACU  _________________________________________________________________________  Subjective: No acute events since surgery. Recovering in PACU. Pain well controlled.   Objective:  General: no acute distress, appropriate affect Neurologic: alert, answering questions appropriately, following commands Respiratory: unlabored breathing on room air Neck: no hematoma appreciated Skin: dressing clear/dry/intact  MSK (spine):  -Strength exam      Right  Left Grip strength                5/5  3/5 Interosseus   5/5   3/5 Wrist extension  5/5  4/5 Wrist flexion   5/5  4/5 Elbow flexion   5/5  4/5 Deltoid    5/5  4/5  -Sensory exam    Sensation intact to light touch in C5-T1 nerve distributions of bilateral upper extremities   Patient name: Christopher Hughes Patient MRN: [id number] Date: 03/15/2024

## 2024-03-15 NOTE — Anesthesia Procedure Notes (Signed)
 Procedure Name: Intubation Date/Time: 03/15/2024 7:46 AM  Performed by: Wynonia Alfonzo LABOR, CRNAPre-anesthesia Checklist: Patient identified, Emergency Drugs available, Suction available and Patient being monitored Patient Re-evaluated:Patient Re-evaluated prior to induction Oxygen Delivery Method: Circle System Utilized Preoxygenation: Pre-oxygenation with 100% oxygen Induction Type: IV induction Ventilation: Mask ventilation without difficulty Laryngoscope Size: Glidescope and 3 Grade View: Grade I Tube type: Oral Tube size: 7.5 mm Number of attempts: 1 Airway Equipment and Method: Stylet and Oral airway Placement Confirmation: ETT inserted through vocal cords under direct vision, positive ETCO2 and breath sounds checked- equal and bilateral Secured at: 22 cm Tube secured with: Tape Dental Injury: Teeth and Oropharynx as per pre-operative assessment

## 2024-03-15 NOTE — Transfer of Care (Signed)
 Immediate Anesthesia Transfer of Care Note  Patient: Christopher Hughes  Procedure(s) Performed: ANTERIOR CERVICAL DECOMPRESSION/DISCECTOMY FUSION 2 LEVELS  Patient Location: PACU  Anesthesia Type:General  Level of Consciousness: sedated  Airway & Oxygen Therapy: Patient Spontanous Breathing and Patient connected to face mask oxygen  Post-op Assessment: Report given to RN and Post -op Vital signs reviewed and stable  Post vital signs: Reviewed and stable  Last Vitals:  Vitals Value Taken Time  BP 133/87 03/15/24 13:00  Temp    Pulse 109 03/15/24 13:03  Resp 16 03/15/24 13:03  SpO2 98 % 03/15/24 13:03  Vitals shown include unfiled device data.  Last Pain:  Vitals:   03/15/24 0606  TempSrc:   PainSc: 9          Complications: No notable events documented.

## 2024-03-15 NOTE — H&P (Addendum)
 Orthopedic Spine Surgery H&P Note  Assessment: Patient is a 58 y.o. male with cervical radiculopathy   Plan: -Out of bed as tolerated, activity as tolerated, no brace -Covered the risks, benefits, and alternatives of surgery again with the patient this morning. After this conversation, patient elected to proceed -Written consent verified -Hold anticoagulation in anticipation of surgery -Ancef  and TXA on all to OR -NPO for procedure -Site marked -To OR when ready  ___________________________________________________________________________  Chief Complaint: neck pain that goes into the LUE  History: Patient is 58 y.o. male who has been previously seen in the office for neck pain that radiates into the left upper extremity. Feels pain going in to the lateral shoulder, lateral arm, dorsal forearm, and hand. Work up was consistent with cervical radiculopathy. His symptoms failed to improve with conservative treatment so operative management was discussed at the last office visit. The patient presents today with no changes in his symptoms since the last office visit. See previous office note for further details.    Review of systems: General: denies fevers and chills, myalgias Neurologic: denies recent changes in vision, slurred speech Abdomen: denies nausea, vomiting, hematemesis Respiratory: denies cough, shortness of breath  Past medical history: Depression History of substance abuse   Allergies: codeine   Past surgical history:  None   Social history: Reports use of nicotine  product (smoking, vaping, patches, smokeless) Alcohol use: Yes, 1 drink per week Denies recreational drug use  Family history: -reviewed and not pertinent to cervical radiculopathy   Physical Exam:  General: no acute distress, appears stated age Neurologic: alert, answering questions appropriately, following commands Cardiovascular: regular rate, no cyanosis Respiratory: unlabored breathing on room  air, symmetric chest rise Psychiatric: appropriate affect, normal cadence to speech   MSK (spine):  -Strength exam      Left  Right Grip strength                3/5  5/5 Interosseus   3/5   5/5 Wrist extension  4/5  5/5 Wrist flexion   4/5  5/5 Elbow flexion   4/5  5/5 Deltoid    4/5  5/5  -Sensory exam    Sensation intact to light touch in C5-T1 nerve distributions of bilateral upper extremities   Patient name: Christopher Hughes Patient MRN: [id number] Date: 03/15/2024

## 2024-03-15 NOTE — Anesthesia Postprocedure Evaluation (Signed)
 Anesthesia Post Note  Patient: Christopher Hughes  Procedure(s) Performed: ANTERIOR CERVICAL DECOMPRESSION/DISCECTOMY FUSION 2 LEVELS     Patient location during evaluation: PACU Anesthesia Type: General Level of consciousness: awake and alert Pain management: pain level controlled Vital Signs Assessment: post-procedure vital signs reviewed and stable Respiratory status: spontaneous breathing, nonlabored ventilation and respiratory function stable Cardiovascular status: blood pressure returned to baseline and stable Postop Assessment: no apparent nausea or vomiting Anesthetic complications: no   No notable events documented.  Last Vitals:  Vitals:   03/15/24 1330 03/15/24 1345  BP: (!) 157/100 (!) 155/88  Pulse: (!) 105 (!) 103  Resp: 14 12  Temp:    SpO2: 94% 94%    Last Pain:  Vitals:   03/15/24 1349  TempSrc:   PainSc: 7                  Hisashi Amadon,W. EDMOND

## 2024-03-15 NOTE — Op Note (Signed)
 Orthopedic Spine Surgery Operative Report  Procedure: C5/6, C6/7 anterior cervical discectomy and fusion C5/6 and C6/7 placement of structural allograft interbody spacer C5, C6, C7 anterior plate instrumentation Intraoperative use of microscope  Modifier: none  Date of procedure: 03/15/2024  Patient name: Christopher Hughes MRN: [id number] DOB: 04-Jul-1965  Surgeon: Ozell Ada, MD Assistant: none Pre-operative diagnosis: cervical radiculopathy Post-operative diagnosis: same as above Findings: degenerative disc and anterior osteophytes at C5/6 and C6/7  Specimens: none Anesthesia: general EBL: 30cc Complications: none Pre-incision antibiotic: ancef  Pre-incision decadron : 10mg  TXA was given prior to incision  Implants:  Implant Name Type Inv. Item Serial No. Manufacturer Lot No. LRB No. Used Action  LOG [id number] - TITAN #508 CERVICAL IMPLANT SET - 1          [employer] [id number] - [id number] Bone Implant [employer] [id number] [facility] MEDICAL  N/A 1 Implanted  ALLOGRAFT TRIAD [employer] CC - [id number] Bone Implant [employer] CC [id number] [employer]  N/A 1 Implanted and Explanted  ALLOGRAFT [employer] CC - [id number] Bone Implant [employer]OGRA[employer] CC [id number] [employer]  N/A 1 Implanted  PLATE ACP 8.3K61 2LVL - [id number] Plate PLATE ACP 8.3K61 2LVL  [employer]  N/A 1 Implanted  SCREW ACP VA SD 3.5X15 - [id number] Screw SCREW ACP VA SD 3.5X15  [employer]  N/A 6 Implanted      Indication for procedure: Patient is a 58 y.o. male who presented to the office with symptoms consistent with cervical radiculopathy. The patient had tried conservative treatments that did not provide any lasting relief. As result, operative management was discussed. A C5-7 anterior cervical discectomy and fusion was presented as a treatment option. The risks, benefits, and alternatives of surgery were covered with the patient. All the  patient's questions were answered to his satisfaction. After this discussion, the patient expressed understanding and elected to proceed with surgical intervention.  Procedure Description: The patient was met in the pre-operative holding area. The patient's identity and consent were verified. The operative site was marked. The patient's remaining questions about the surgery were answered. The patient was brought back to the operating room. General anesthesia was induced and an endotracheal tube was placed by the anesthesia staff. The patient was transferred to the flat top [place] table in the supine position. All bony prominences were well padded. A bump was placed underneath the patient's shoulders to extend the neck slightly.  The patient's shoulders were than taped to the bed to improve the fluoroscopic visualization during the procedure. His facial hair over the neck was shaved with an neurosurgeon. The surgical area was cleansed with alcohol. Fluoroscopy was then brought in to check rotation on the AP image and to mark the levels on the lateral image. The patient's skin was then prepped and draped in a standard, sterile fashion. A time out was performed that identified the patient, the procedure, and the operative levels. All team members agreed with what was stated in the time out.   A left-sided transverse incision was made in the middle of the previously marked levels. Incision was taken sharply down through the skin and dermis. Electrocautery was used to dissect down to the level of the platysma. A Metzenbaum scissors was used to elevate flaps both cranially and caudally above the platysma. A small opening was made in the platysma with the Metzenbaum scissors. The scissors were then placed under the platysma and electrocautery was used on top of the scissors to split  the platysma. Blunt dissection was carried out medial to the sternocleidomastoid to identify the omohyoid. The omohyoid was then  dissected over its lateral aspect with the Metzenbaum scissors. An appendiceal retractor was placed around the carotid sheath to retract it laterally and a cloward retractor was placed medially to retractor the esophagus and trachea medially. The interval between these structures was then bluntly dissected with the Metzenbaum scissors. At this point, the prevertebral fascia was visible within the wound. A kittner was used to dissect the prevertebral fascia off the vertebral bodies and discs. A metallic sucker tip was placed over the disc thought to be the correct level. A lateral fluoroscopic shot was then used to confirm the level. Once the correct level was identified, electrocautery was used to mark the disc space. The retractors that were previously placed were then put back into the wound.  Electrocautery was used to elevate the longus coli off the bone on each side from C5-C7. The operative microscope was brought in to improve lighting and visualization.   Shadowline retractor blades were then placed under the longus coli on each side and the self-retainer was attached to these retractor blades. A long handle fifteen blade was then used to incise the C6/7 disc space along the endplates and longitudinally to connect these end plate incisions to create a rectangular incision in the disc. A pituitary was used to remove the incised disc material. A 2 kerrison was used to remove the anterior osteophyte and square the inferior endplate of the cranial vertebra. A combination of straight and curved curettes were used to remove further disc material and the cartilaginous aspect of the endplates. This was done until both the inferior and superior endplates had been prepared from the uncus to uncus and from ventral vertebral body to the posterior osteophytes. A burr was then used to remove the posterior vertebral osteophytes. Once the PLL was visualized within the wound, a nerve hook was used to develop the plane  between the PLL and the dura. While lifting up the PLL with the nerve hook, a 1 kerrison was placed under the PLL and used to remove the PLL. Once some of the PLL had been removed, a combination of 1 and 2 kerrisons were used to continue removing the PLL until it had been completely removed. A curved curette was then placed into the foramen and pointed ventrally. It was used to remove soft tissue and the deep aspect of the uncus. A nerve hook was then easily passed into the foramen. The same process was repeated for the other foramen. Lateral fluoroscopic imaging was used to guide the insertion of trials into the disc space in a serial fashion, starting with the smallest trial. AP and lateral fluoroscopic images were obtained and confirmed satisfactory position of the trial. A 7 was selected for use as the final implant.  This size structural allograft interbody implant was then placed into the prepared disc space and lightly malleted into place. AP and lateral fluoroscopic images was obtained and confirmed satisfactory position of the final implant.  The shadowline retractor blades were then removed and replaced under the longus coli around the C5/6 disc space. A long handle fifteen blade was then used to incise the disc space along the endplates and longitudinally to connect these end plate incisions to create a rectangular incision in the disc. A pituitary was used to remove the incised disc material. A 2 kerrison was used to remove the anterior osteophyte and square the inferior endplate  of the cranial vertebra. A combination of straight and curved curettes were used to remove further disc material and the cartilaginous aspect of the endplates. This was done until both the inferior and superior endplates had been prepared from the uncus to uncus and from ventral vertebral body to the posterior osteophytes. A burr was then used to remove the posterior vertebral osteophytes. Once the PLL was visualized within  the wound, a nerve hook was used to develop the plane between the PLL and the dura. While lifting up the PLL with the nerve hook, a 1 kerrison was placed under the PLL and used to remove the PLL. Once some of the PLL had been removed, a combination of 1 and 2 kerrisons were used to continue removing the PLL until it had been completely removed. A curved curette was then placed into the foramen and pointed ventrally. It was used to remove soft tissue and the deep aspect of the uncus. A nerve hook was then easily passed into the foramen. The same process was repeated for the other foramen. Lateral fluoroscopic imaging was used to guide the insertion of trials into the disc space in a serial fashion, starting with the smallest trial. AP and lateral fluoroscopic images were obtained and confirmed satisfactory position of the trial. A 6 was selected for use as the final implant.  This size structural allograft interbody spacer was then placed into the prepared disc space and lightly malleted into place. The allograft broke as it was inserted. A curette was used to remove the allograft interbody spacer. A new 6 structural allograft was then opened and lightly impacted into place. This one went in without issue. AP and lateral fluoroscopic images was obtained and confirmed satisfactory position of the final implant.  The microscope was then moved away from the operative field and the retractor blades were removed from the wound. A cloward was then used to retract the esophagus and trachea away from the ventral vertebral bodies and another was used to retract the carotid and sternocleidomastoid laterally. A plate was then selected and placed over the ventral aspects of the vertebral bodies. No soft tissue structures were underneath the plate. An awl was put into the wound and put into one of the screw holes in the plate. It was aimed slightly medially. It was used to create a hole in the bone. A 15mm screw was then  inserted into the hole. The same steps were repeated with the awl and screw to place the same sized screws in the remaining holes within the plate. Again, the wound was checked and no soft tissue structures were seen underneath the plate.   Final AP and lateral fluoroscopic films were taken and confirmed satisfactory position of the plate and interbody implants. The screws were final tightened. Hemostasis was achieved. The platysma was reapproximated using 2-0 vicryl. The deep dermal layer was closed with 2-0 vicryl. The skin was closed with a 3-0 monocryl. All counts were correct at the end of the procedure. Dermabond was applied over the skine. The wound was dressed with 4x4 gauze and tegaderm. The patient was awakened from anesthesia and transferred to a stretcher. The patient was brought back to the post-anesthesia care unit in stable condition by the anesthesia staff.   Post-operative plan: The patient will recover in the post-anesthesia care unit with plan to go home when his airway has been monitored, his neuro exam is unchanged from pre-op, he voids, he ambulates, and he tolerates PO. The patient  will be out of bed as tolerated with no collar or brace. The patient will be seen in the office in approximately 2 weeks.   Ozell Ada, MD Orthopedic Surgeon

## 2024-03-15 NOTE — Discharge Instructions (Signed)
 Orthopedic Surgery Discharge Instructions  Patient name: Christopher Hughes Procedure Performed: C5-7 anterior cervical discectomy and fusion Date of Surgery: 03/15/2024 Surgeon: Ozell Ada, MD  Pre-operative Diagnosis: cervical radiculopathy Post-operative Diagnosis: same as above  Discharged to: home Discharge Condition: stable  Activity: You should refrain from bending, lifting, or twisting with objects greater than ten pounds until three months after surgery. You are encouraged to walk as much as desired. You can perform household activities such as cleaning dishes, doing laundry, vacuuming, etc. as long as the ten-pound restriction is followed.  Dr. Ada strongly encourages you to quit all nicotine  containing products to reduce your risk of post-operative complication.   Incision Care: Your incision site has a dressing over it. That dressing should remain in place and dry at all times for a total of one week after surgery. After one week, you can remove the dressing. Underneath the dressing, you will find skin glue. You should leave the skin glue in place. It will fall off with time. Do not pick, rub, or scrub at it. Do not put cream or lotion over the surgical area. After one week and once the dressing is off, it is okay to let soap and water run over your incision. Again, do not pick, scrub, or rub at the skin glue when bathing. Do not submerge (e.g., take a bath, swim, go in a hot tub, etc.) until six weeks after surgery. There may be some bloody drainage from the incision into the dressing after surgery. This is normal. You do not need to replace the dressing. Continue to leave it in place for the one week as instructed above. Should the dressing become saturated with blood or drainage, please call the office for further instructions.   Medications: You have been prescribed oxycodone . This is a narcotic pain medication and should only be taken as prescribed. You should not drink  alcohol or operate heavy machinery (including driving) while taking this medication. The oxycodone  can cause constipation as a side effect. For that reason, you have been prescribed senna and miralax . These are both laxatives. You do not need to take this medication if you develop diarrhea. Should you remain constipated even while taking these medications, please increase the dose of miralax  to twice daily. Tylenol  has been prescribed to be taken every 8 hours, which will give you additional pain relief. Robaxin  is a muscle relaxer that has been prescribed to you for muscle spasm type pain. Take this medication as needed.   Do not take NSAIDs (ibuprofen , Aleve , Celebrex, naproxen , meloxicam, etc.) for the first 6 weeks after surgery as there is some evidence that their use may decrease the chances of successful fusion.   In order to set expectations for opioid prescriptions, you will only be prescribed opioids for a total of six weeks after surgery and, at two-weeks after surgery, your opioid prescription will start to tapered (decreased dosage and number of pills). If you have ongoing need for opioid medication six weeks after surgery, you will be referred to pain management. If you are already established with a provider that is giving you opioid medications, you should schedule an appointment with them for six weeks after surgery if you feel you are going to need another prescription. State law only allows for opioid prescriptions one week at a time. If you are running out of opioid medication near the end of the week, please call the office during business hours before running out so I can send you another  prescription.   You may resume any home blood thinners (aspirin, warfarin, lovenox, apixaban, plavix, xarelto, etc.) 72 hours after your surgery. Take these medications as they were previously prescribed.   Driving: You should not drive while taking narcotic pain medications. You should start getting  back to driving slowly and you may want to try driving in a parking lot before doing anything more.   Diet: You are safe to resume your regular diet. A common complication after cervical spine surgery is trouble swallowing especially if the incision was made over the front part of your neck. This complication happens often and, the vast majority of the time, it is a temporary problem that gets better with time. The first few days after surgery, you should take more bites than normal to break the food into smaller pieces before swallowing. With time as the swallowing becomes easier, you can gradually get back to taking bites like you normally would.   Reasons to Call the Office After Surgery: You should feel free to call the office with any concerns or questions you have in the post-operative period, but you should definitely notify the office if you develop: -shortness of breath, chest pain, or trouble breathing -excessive bleeding, drainage, redness, or swelling around the surgical site -fevers, chills, or pain that is getting worse with each passing day -persistent nausea or vomiting -new weakness in any extremity, new or worsening numbness or tingling in any extremity -numbness in the groin, bowel or bladder incontinence -other concerns about your surgery  Follow Up Appointments: You should have an office appointment scheduled for approximately two weeks after surgery. If you do not remember when this appointment is or do not already have it scheduled, please call the office to schedule.   Office Information:  -Ozell Ada, MD -Phone number: [phone] -Address: [address]       [place], [place] [address]

## 2024-03-16 ENCOUNTER — Encounter (HOSPITAL_COMMUNITY): Payer: Self-pay | Admitting: Orthopedic Surgery

## 2024-03-21 ENCOUNTER — Telehealth: Payer: Self-pay | Admitting: Orthopedic Surgery

## 2024-03-21 MED ORDER — OXYCODONE HCL 5 MG PO TABS: 2.5000 mg | ORAL_TABLET | ORAL | 0 refills | Status: DC | PRN

## 2024-03-21 NOTE — Telephone Encounter (Signed)
 Pt called saying that he is almost out of his medication and would like a refill. Pharmacy is [facility] [address]. Call back number is [phone].

## 2024-03-28 ENCOUNTER — Ambulatory Visit (INDEPENDENT_AMBULATORY_CARE_PROVIDER_SITE_OTHER): Payer: MEDICAID | Admitting: Orthopedic Surgery

## 2024-03-28 ENCOUNTER — Other Ambulatory Visit (INDEPENDENT_AMBULATORY_CARE_PROVIDER_SITE_OTHER): Payer: MEDICAID

## 2024-03-28 DIAGNOSIS — Z981 Arthrodesis status: Secondary | ICD-10-CM

## 2024-03-28 MED ORDER — ACETAMINOPHEN 500 MG PO TABS: 1000.0000 mg | ORAL_TABLET | Freq: Three times a day (TID) | ORAL | 0 refills | Status: AC

## 2024-03-28 MED ORDER — OXYCODONE HCL 5 MG PO TABS: 2.5000 mg | ORAL_TABLET | ORAL | 0 refills | Status: DC | PRN

## 2024-03-28 NOTE — Progress Notes (Signed)
 Orthopedic Surgery Post-operative Office Visit  Procedure: C5-7 ACDF  Date of Surgery: 03/15/2024 (~2 weeks post-op)  Assessment: Patient is a 58 y.o. male who is still having neck pain after surgery but arm pain has improved significantly   Plan: -Operative plans complete -Out of bed as tolerated, no collar/brace needed -No bending/lifting/twisting greater than 10 pounds -Okay to let soap/water run over incision but do not submerge -Pain management: tylenol , weaning oxycodone  -Return to office in 4 weeks, x-rays needed at next visit: AP/lateral cervical  ___________________________________________________________________________   Subjective: Patient has noticed improvement in his left arm pain since surgery.  He said he is not having any more radiating left arm pain.  He has noticed neck pain.  He feels it in the lower cervical region.  He has been able to decrease his pain medication use over time.  Has not noticed any redness or drainage around his incision.  Objective:  General: no acute distress, appropriate affect Neurologic: alert, answering questions appropriately, following commands Respiratory: unlabored breathing on room air Skin: incision is well approximated with no erythema, induration, active/expressible drainage  MSK (spine):  -Strength exam      Left  Right Grip strength                4/5  5/5 Interosseus   4/5   5/5 Wrist extension  4/5  5/5 Wrist flexion   4/5  5/5 Elbow flexion   4/5  5/5 Deltoid    5/5  5/5  -Sensory exam   Sensation intact to light touch in C5-T1 nerve distributions of bilateral upper extremities   Imaging: X-rays of the cervical spine from 03/28/2024 were independently reviewed and interpreted, showing interbody devices at C5/6 and C6/7.  Interbody's appear in appropriate position.  No lucency seen around the interbody's.  There is anterior instrumentation from C5-C7.  Subtle lucency seen around the C5 screws. None of the screws  are backed out.  No fracture or dislocation seen.   Patient name: NEFTALY SWISS Patient MRN: [id number] Date of visit: 03/28/2024

## 2024-04-01 ENCOUNTER — Telehealth: Payer: Self-pay | Admitting: Orthopedic Surgery

## 2024-04-01 MED ORDER — OXYCODONE HCL 5 MG PO TABS: 5.0000 mg | ORAL_TABLET | ORAL | 0 refills | Status: AC | PRN

## 2024-04-01 NOTE — Telephone Encounter (Signed)
 Pt states he's having pain on the right side of the neck that runs down the arm (not the surgical).  Pt request something for pain.

## 2024-04-05 ENCOUNTER — Telehealth: Payer: Self-pay

## 2024-04-05 NOTE — Telephone Encounter (Signed)
 I called both numbers [phone] and [phone] and lmovm for both numbers to call back and ask for me.

## 2024-04-05 NOTE — Telephone Encounter (Signed)
 Triage vm from the patient's significant other:  the patient is s/p ACDF, 2 level, on 03/15/24. She reports that the patient stood up the other night, and something popped. Since then, he has had pain between his neck and right scapula, and down to the right elbow. She says the patient cannot hold his head up. They are asking for a call back from Dr. Georgina --- she is wanting to know if the patient should come in here to be seen or go to his PCP. His next ov is on 04/25/24. [phone].

## 2024-04-07 ENCOUNTER — Ambulatory Visit (INDEPENDENT_AMBULATORY_CARE_PROVIDER_SITE_OTHER): Payer: MEDICAID | Admitting: Radiology

## 2024-04-07 ENCOUNTER — Other Ambulatory Visit: Payer: MEDICAID

## 2024-04-07 DIAGNOSIS — Z981 Arthrodesis status: Secondary | ICD-10-CM

## 2024-04-07 MED ORDER — OXYCODONE HCL 5 MG PO TABS: 5.0000 mg | ORAL_TABLET | ORAL | 0 refills | Status: AC | PRN

## 2024-04-07 NOTE — Addendum Note (Signed)
 Addended by: GEORGINA SHARPER on: 04/07/2024 08:06 AM   Modules accepted: Orders

## 2024-04-07 NOTE — Telephone Encounter (Signed)
 Coming in today @ 415 for xrays

## 2024-04-13 ENCOUNTER — Ambulatory Visit (INDEPENDENT_AMBULATORY_CARE_PROVIDER_SITE_OTHER): Payer: MEDICAID | Admitting: Orthopedic Surgery

## 2024-04-13 DIAGNOSIS — Z981 Arthrodesis status: Secondary | ICD-10-CM

## 2024-04-13 MED ORDER — OXYCODONE HCL 5 MG PO TABS: 5.0000 mg | ORAL_TABLET | ORAL | 0 refills | Status: AC | PRN

## 2024-04-13 MED ORDER — ACETAMINOPHEN 500 MG PO TABS: 1000.0000 mg | ORAL_TABLET | Freq: Three times a day (TID) | ORAL | 0 refills | Status: AC

## 2024-04-13 MED ORDER — PREGABALIN 75 MG PO CAPS: 75.0000 mg | ORAL_CAPSULE | Freq: Two times a day (BID) | ORAL | 0 refills | Status: AC

## 2024-04-13 MED ORDER — METHYLPREDNISOLONE 4 MG PO TBPK: ORAL_TABLET | ORAL | 0 refills | Status: AC

## 2024-04-13 NOTE — Progress Notes (Signed)
 Orthopedic Surgery Post-operative Office Visit   Procedure: C5-7 ACDF  Date of Surgery: 03/15/2024 (~4 weeks post-op)   Assessment: Patient is a 59 y.o. male who has noticed improvement in his left arm pain but has now developed right sided arm pain from his neck to his elbow     Plan: -Operative plans complete -Out of bed as tolerated, no collar/brace needed -No bending/lifting/twisting greater than 10 pounds -Okay to let soap/water run over incision but do not submerge -He does not demonstrate any neurologic deficits on his exam today, so I recommended continued monitoring.  With recent onset of pain, I expect it will get better with conservative treatment.  He can continue with the oxycodone  and Tylenol .  Prescribed a Medrol  Dosepak and Lyrica  for additional pain relief -A component of his pain based on exam does appear to come from the shoulder but I would not expect this much pain and rest pain to be coming from the shoulder -Return to office in 2 weeks (already has this appt), x-rays needed at next visit: AP/lateral cervical   ___________________________________________________________________________     Subjective: Patient's left arm pain remains better since surgery.  Recently, he has developed worsening pain in his neck and pain into the right upper extremity.  He feels it in the area of the scapula, anterior shoulder, and lateral arm to the level of the elbow.  He said his neck popped one of the days since he was last in the office and that started the pain.  There was no trauma or injury.  He initially was having trouble holding up his head but said that has gotten better.  He notes if he moves certain ways that aggravates the pain.  He said his arm will feel heavier at times as well.  He has not noticed any redness or drainage around his incision.  He has the pain with activity and at rest.   Objective:   General: no acute distress, appropriate affect, smells of  cigarettes Neurologic: alert, answering questions appropriately, following commands Respiratory: unlabored breathing on room air Skin: incision is well healed with no erythema, induration, active/expressible drainage   MSK (spine):   -Strength exam                                                   Left                  Right Grip strength                5/5                  5/5 Interosseus                  5/5                  5/5 Wrist extension            5/5                  5/5 Wrist flexion                 5/5                  5/5 Elbow flexion  5/5                  5/5 Deltoid                          5/5                  4/5   -Sensory exam                         Sensation intact to light touch in C5-T1 nerve distributions of bilateral upper extremities  -Brachioradialis DTR: 1/4 on the left, 1/4 on the right -Biceps DTR: 1/4 on the left, 1/4 on the right   -Spurling: negative bilaterally -Hoffman sign: negative -Clonus: no beats bilaterally -Interosseous wasting: none seen -Grip and release test: negative -Romberg: negative -Gait: normal   Left shoulder exam: no pain through range of motion Right shoulder exam: Pain with Jobe but no weakness, pain with external rotation but no weakness, negative drop arm sign, negative belly press, pain with external rotation past 70 degrees, pain with internal rotation past 60 degrees     Imaging: X-rays of the cervical spine from 03/28/2024 were previously independently reviewed and interpreted, showing interbody devices at C5/6 and C6/7.  Interbody's appear in appropriate position.  No lucency seen around the interbody's.  There is anterior instrumentation from C5-C7.  Subtle lucency seen around the C5 screws. None of the screws are backed out.  No fracture or dislocation seen.     Patient name: Christopher Hughes Patient MRN: [id number] Date of visit: 04/13/2024

## 2024-04-22 ENCOUNTER — Telehealth: Payer: Self-pay | Admitting: Orthopedic Surgery

## 2024-04-22 ENCOUNTER — Encounter: Payer: Self-pay | Admitting: Radiology

## 2024-04-22 MED ORDER — OXYCODONE HCL 5 MG PO TABS: 5.0000 mg | ORAL_TABLET | ORAL | 0 refills | Status: DC | PRN

## 2024-04-22 NOTE — Telephone Encounter (Signed)
 Pt called saying that he needs a refill of his medication before the bad weather comes in. He says that he is going to run out. Medication is Oxycodone . [facility] [address] Call back number is [phone]

## 2024-04-24 ENCOUNTER — Telehealth: Payer: Self-pay

## 2024-04-24 NOTE — Telephone Encounter (Signed)
 Appt cancelleation

## 2024-04-25 ENCOUNTER — Encounter: Payer: MEDICAID | Admitting: Orthopedic Surgery

## 2024-04-26 ENCOUNTER — Telehealth: Payer: Self-pay | Admitting: Orthopedic Surgery

## 2024-04-26 MED ORDER — OXYCODONE HCL 5 MG PO TABS: 2.5000 mg | ORAL_TABLET | Freq: Four times a day (QID) | ORAL | 0 refills | Status: DC | PRN

## 2024-04-26 NOTE — Telephone Encounter (Signed)
 Patient called. He would like pain medication called in.

## 2024-04-27 ENCOUNTER — Telehealth: Payer: Self-pay | Admitting: Orthopedic Surgery

## 2024-04-27 ENCOUNTER — Ambulatory Visit: Payer: MEDICAID | Admitting: Orthopedic Surgery

## 2024-04-27 ENCOUNTER — Other Ambulatory Visit: Payer: MEDICAID

## 2024-04-27 DIAGNOSIS — Z981 Arthrodesis status: Secondary | ICD-10-CM | POA: Diagnosis not present

## 2024-04-27 NOTE — Progress Notes (Signed)
 Orthopedic Surgery Post-operative Office Visit   Procedure: C5-7 ACDF  Date of Surgery: 03/15/2024 (~6 weeks post-op)   Assessment: Patient is a 59 y.o. male who has noticed improvement in his left arm pain but has now developed right sided arm pain from his neck to his elbow     Plan: -Operative plans complete -Out of bed as tolerated, no collar/brace needed -No bending/lifting/twisting greater than 10 pounds -Treatments tried for his right arm pain: tylenol , medrol  dose pak, lyrica , oxycodone  -He has noticed symptomatic improvement, so will continue to monitor his right arm symptoms for now. Continue lyrica  nightly (decrease from BID) and oxycodone  PRN. Will slowly wean him off the opioids which I went over with him in the office today -Okay to submerge wound at this point -Return to office in 6 weeks, x-rays needed at next visit: AP/lateral/flex/ex cervical   ___________________________________________________________________________     Subjective: Patient has noticed improvement in his right arm pain since he was last in the office. His pain is now annoying but tolerable. He feels it along the posterior aspect of the right arm today. It does not radiate past the elbow. Still having some neck pain but that is improving. No radiating left arm pain. Felt the lyrica  made him depressed so he stopped it.    Objective:   General: no acute distress, appropriate affect, smells of cigarettes Neurologic: alert, answering questions appropriately, following commands Respiratory: unlabored breathing on room air Skin: incision is well healed   MSK (spine):   -Strength exam                                                   Left                  Right Grip strength                5/5                  5/5 Interosseus                  5/5                  5/5 Wrist extension            5/5                  5/5 Wrist flexion                 5/5                  5/5 Elbow flexion                 5/5                  5/5 Deltoid                          5/5                  5/5   -Sensory exam                         Sensation intact to light touch in C5-T1 nerve distributions of bilateral upper extremities  Imaging: X-rays of the cervical spine from 04/27/2024 were independently reviewed and interpreted, showing C5/6 allograft interbody in appropriate position. C6/7 allograft not well visualized with shoulder obscuring the view. Plate along the anterior aspect of the vertebra from C5-7. There are screws in the plate with no lucency seen around the screws. No fracture or dislocation seen.     Patient name: Christopher Hughes Patient MRN: [id number] Date of visit: 04/27/24

## 2024-05-03 ENCOUNTER — Telehealth: Payer: Self-pay | Admitting: Orthopedic Surgery

## 2024-05-03 MED ORDER — OXYCODONE HCL 5 MG PO TABS: 2.5000 mg | ORAL_TABLET | Freq: Four times a day (QID) | ORAL | 0 refills | Status: AC | PRN
